# Patient Record
Sex: Male | Born: 1995 | Race: White | Hispanic: No | Marital: Married | Smoking: Former smoker
Health system: Southern US, Community
[De-identification: ages and names within clinical notes are randomized; demographics above are authoritative.]

## PROBLEM LIST (undated history)

## (undated) DIAGNOSIS — F909 Attention-deficit hyperactivity disorder, unspecified type: Secondary | ICD-10-CM

## (undated) DIAGNOSIS — S83249A Other tear of medial meniscus, current injury, unspecified knee, initial encounter: Secondary | ICD-10-CM

## (undated) HISTORY — PX: FOOT SURGERY: SHX648

## (undated) HISTORY — DX: Other tear of medial meniscus, current injury, unspecified knee, initial encounter: S83.249A

## (undated) HISTORY — PX: DENTAL SURGERY: SHX609

---
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
## 2014-11-28 ENCOUNTER — Emergency Department: Payer: 59

## 2014-11-28 ENCOUNTER — Emergency Department
Admission: EM | Admit: 2014-11-28 | Discharge: 2014-11-28 | Disposition: A | Discharge status: Home / Self Care | Payer: 59 | Attending: Emergency Medicine | Admitting: Emergency Medicine

## 2014-11-28 ENCOUNTER — Encounter: Payer: Self-pay | Admitting: Emergency Medicine

## 2014-11-28 DIAGNOSIS — X58XXXA Exposure to other specified factors, initial encounter: Secondary | ICD-10-CM | POA: Insufficient documentation

## 2014-11-28 DIAGNOSIS — S8391XA Sprain of unspecified site of right knee, initial encounter: Secondary | ICD-10-CM

## 2014-11-28 DIAGNOSIS — Z720 Tobacco use: Secondary | ICD-10-CM | POA: Diagnosis not present

## 2014-11-28 DIAGNOSIS — S8991XA Unspecified injury of right lower leg, initial encounter: Secondary | ICD-10-CM | POA: Diagnosis present

## 2014-11-28 DIAGNOSIS — Y998 Other external cause status: Secondary | ICD-10-CM | POA: Diagnosis not present

## 2014-11-28 DIAGNOSIS — Y9389 Activity, other specified: Secondary | ICD-10-CM | POA: Insufficient documentation

## 2014-11-28 DIAGNOSIS — Y9289 Other specified places as the place of occurrence of the external cause: Secondary | ICD-10-CM | POA: Insufficient documentation

## 2014-11-28 MED ORDER — TRAMADOL HCL 50 MG PO TABS: 50.0000 mg | ORAL_TABLET | Freq: Four times a day (QID) | ORAL | Status: DC | PRN

## 2014-11-28 MED ORDER — NAPROXEN 500 MG PO TABS: 500.0000 mg | ORAL_TABLET | Freq: Two times a day (BID) | ORAL | Status: DC

## 2014-11-28 NOTE — ED Notes (Signed)
Stepped stepped wrong on it, felt pop

## 2014-11-28 NOTE — ED Provider Notes (Signed)
CSN: [id number]     Arrival date & time 11/28/14  1703 History   First MD Initiated Contact with Patient 11/28/14 1906     Chief Complaint  Patient presents with  . Knee Pain    twisted stepping off stool 2 hour ago     (Consider location/radiation/quality/duration/timing/severity/associated sxs/prior Treatment) HPI  19 year old male stepped off of a stool and felt a pop into his right knee approximately 3:00 this afternoon. Patient was unable to ambulate. He is having significant pain in his right knee. Pain is described as sharp. Pain is moderate to severe. Pain is increased with range of motion and walking and some relief with sitting. No numbness or tingling. No hip or back pain.  History reviewed. No pertinent past medical history. History reviewed. No pertinent past surgical history. No family history on file. History  Substance Use Topics  . Smoking status: Current Every Day Smoker    Types: Cigarettes  . Smokeless tobacco: Not on file  . Alcohol Use: Yes    Review of Systems  Constitutional: Negative.  Negative for fever, chills, activity change and appetite change.  HENT: Negative for congestion, ear pain, mouth sores, rhinorrhea, sinus pressure, sore throat and trouble swallowing.   Eyes: Negative for photophobia, pain and discharge.  Respiratory: Negative for cough, chest tightness and shortness of breath.   Cardiovascular: Negative for chest pain and leg swelling.  Gastrointestinal: Negative for nausea, vomiting, abdominal pain, diarrhea and abdominal distention.  Genitourinary: Negative for dysuria and difficulty urinating.  Musculoskeletal: Positive for joint swelling, arthralgias and gait problem.  Skin: Negative for color change and rash.  Neurological: Negative for dizziness and headaches.  Hematological: Negative for adenopathy.  Psychiatric/Behavioral: Negative for behavioral problems and agitation.      Allergies  Review of patient's allergies indicates  no known allergies.  Home Medications   Prior to Admission medications   Not on File   BP 107/57 mmHg  Pulse 68  Temp(Src) 98.5 F (36.9 C) (Oral)  Resp 18  Ht 6\' 1"  (1.854 m)  Wt 220 lb (99.791 kg)  BMI 29.03 kg/m2  SpO2 97% Physical Exam  Constitutional: He is oriented to person, place, and time. He appears well-developed and well-nourished.  HENT:  Head: Normocephalic and atraumatic.  Eyes: Conjunctivae and EOM are normal. Pupils are equal, round, and reactive to light.  Neck: Normal range of motion. Neck supple.  Cardiovascular: Normal rate and intact distal pulses.   Pulmonary/Chest: Effort normal. No respiratory distress.  Musculoskeletal: Normal range of motion. He exhibits no edema or tenderness.  Examination of the right lower extremity shows patient has no swelling warmth erythema or effusion. He has full range of motion of the hip knee and ankle. Patient is tender along the medial lateral joint line of the right knee. He is able to maintain full extension. Patella tracks well and patellofemoral groove. He is neurovascularly intact in right lower extremity.  Neurological: He is alert and oriented to person, place, and time.  Skin: Skin is warm and dry.  Psychiatric: He has a normal mood and affect. His behavior is normal. Judgment and thought content normal.    ED Course  Procedures (including critical care time) Labs Review Labs Reviewed - No data to display  Imaging Review Dg Knee Complete 4 Views Right  11/28/2014   CLINICAL DATA:  Fall, right knee pain  EXAM: RIGHT KNEE - COMPLETE 4+ VIEW  COMPARISON:  None.  FINDINGS: There is no evidence of fracture, dislocation,  or joint effusion. There is no evidence of arthropathy or other focal bone abnormality. Soft tissues are unremarkable.  IMPRESSION: Negative.   Electronically Signed   By: Christiana Pellant M.D.   On: 11/28/2014 18:05     EKG Interpretation None      MDM   Final diagnoses:  Right knee sprain,  initial encounter    19 year old male with right twisting knee injury. Exam shows no effusion or ligamentous laxity. X-ray show no fracture. Patient will use crutches from home to ambulate. He'll rest ice elevate the lower extremity. Work on gentle knee range of motion. Take naproxen as needed for pain. Follow-up with orthopedics first of next week.    Evon Slack, PA-C 11/28/14 1918  Jene Every, MD 11/28/14 2032

## 2014-11-28 NOTE — ED Notes (Signed)
Pt. Father driving pt. Home.

## 2014-11-28 NOTE — Discharge Instructions (Signed)
Cryotherapy Cryotherapy is when you put ice on your injury. Ice helps lessen pain and puffiness (swelling) after an injury. Ice works the best when you start using it in the first 24 to 48 hours after an injury. HOME CARE  Put a dry or damp towel between the ice pack and your skin.  You may press gently on the ice pack.  Leave the ice on for no more than 10 to 20 minutes at a time.  Check your skin after 5 minutes to make sure your skin is okay.  Rest at least 20 minutes between ice pack uses.  Stop using ice when your skin loses feeling (numbness).  Do not use ice on someone who cannot tell you when it hurts. This includes small children and people with memory problems (dementia). GET HELP RIGHT AWAY IF:  You have white spots on your skin.  Your skin turns blue or pale.  Your skin feels waxy or hard.  Your puffiness gets worse. MAKE SURE YOU:   Understand these instructions.  Will watch your condition.  Will get help right away if you are not doing well or get worse. Document Released: 10/11/2007 Document Revised: 07/17/2011 Document Reviewed: 12/15/2010 [facility] Patient Information 2015 [place], [place]. This information is not intended to replace advice given to you by your health care provider. Make sure you discuss any questions you have with your health care provider.  Combined Knee Ligament Sprain Combined knee ligament sprain is a tear of more than one of the major ligaments of the knee. The four knee ligaments are the anterior cruciate ligament (ACL), posterior cruciate ligament (PCL), medial collateral ligament (MCL) and lateral collateral ligament (LCL). Ligaments connect bones. They often cross a joint to hold the bones together. The ligaments of the knee keep the thigh bone (femur) and shinbone (tibia) in alignment. These ligaments allow the joint to move within a certain range of motion. Movement outside this range causes a ligament strain. Injury to multiple  ligaments at the same time results in difficulty playing sports and in daily living. The most common multiple knee ligament injury involves the ACL and MCL. SYMPTOMS   A "popping" sound heard or felt at the time of injury.  Inability to continue activity after injury.  Inflammation of the knee within 6 hours after injury.  Possibly, deformity of the knee.  Inability to straighten the knee.  Feeling of the knee giving way or buckling.  Sometimes, locking of the knee, if the joint cartilage (meniscus) is injured.  Rarely, numbness, weakness, paralysis, discoloration, or coldness, due to nerve or blood vessel injury. CAUSES  Spraining of multiple ligaments occurs when a force is placed on the ligaments that exceeds their strength. This is often caused by a direct hit (trauma). It may also be caused by a non-contact injury (hyperextending the knee while twisting it).  RISK INCREASES WITH:  Contact sports (football, rugby, lacrosse). Sports that involve pivoting, jumping, cutting, or changing direction (basketball, gymnastics, soccer, volleyball). Sports on uneven ground (cross-country running, soccer).  Poor strength and/or flexibility.  Improper fitted or padded equipment. PREVENTION  Warm up and stretch properly before activity.  Maintain physical fitness:  Thigh, leg, and knee flexibility.  Muscle strength and endurance.  Learn and use proper exercise technique.  Wear proper and well fitting equipment (correct length of cleats for surface). PROGNOSIS  Without treatment, the knee will continue to give way and become vulnerable to recurring injury. Recurring injury can happen during athletics or daily  living. If the injury includes damage to a nerve or artery, the chance of a poor outcome increases. Surgery is often needed to regain stability of the knee. RELATED COMPLICATIONS  Frequently recurring symptoms, including:  Knee giving way.  Joint  instability.  Inflammation.  Injury to the joint cartilage (meniscus). This may result in locking and/or swelling of the knee.  Injury to joint (articular) cartilage of the thigh bone or shinbone. This may result in arthritis of the knee.  Injury to other ligaments of the knee.  Knee stiffness (loss of knee motion).  Permanent injury to nerves (numbness, weakness, or paralysis) or arteries.  Removal (amputation) of the leg, due to nerve or artery injury. TREATMENT  Treatment first involves medicine and ice, to reduce pain and inflammation. Crutches may be advised, to decrease pain while walking. The knee may be restrained. Rehabilitation focuses on reducing swelling, regaining range of motion, and regaining muscle control and strength. It may also include receiving proper use training, wearing a brace, and education. (Avoid sports that involve pivoting, cutting, changing direction, jumping and landing). Surgery often offers the best chance for full recovery. Surgery from combined ACL/MCL injury involves replacement (reconstruction) of the ACL. This also allows for MCL healing. Despite surgery, some athletes may never return to their prior level of competition. The ability to return to sports depends on the related injuries and demands of the sport.  MEDICATION   If pain medicine is needed, nonsteroidal anti-inflammatory medicines (aspirin and ibuprofen), or other minor pain relievers (acetaminophen), are often advised.  Do not take pain medicine for 7 days before surgery.  Stronger pain relievers may be prescribed. Use only as directed and only as much as you need.  Contact your caregiver immediately if any bleeding, stomach upset, or signs of an allergic reaction occur. COLD THERAPY  Cold treatment (icing) should be applied for 10 to 15 minutes every 2 to 3 hours for inflammation and pain, and immediately after activity that aggravates your symptoms. Use ice packs or an ice massage. SEEK  MEDICAL CARE IF:   Symptoms get worse or do not improve in 6 weeks, despite treatment.  After injury or surgery, any of the following occur:  Pain, numbness, coldness, or a blue, gray, or dark color occurs in the foot or toenails.  Increased pain, swelling, redness, drainage of fluids, or bleeding in the affected area.  Signs of infection (headache, muscle aches, dizziness, or a general ill feeling with fever).  New, unexplained symptoms develop. (Drugs used in treatment may produce side effects.) Document Released: 04/24/2005 Document Revised: 07/17/2011 Document Reviewed: 08/06/2008 [facility] Patient Information 2015 [place], [place]. This information is not intended to replace advice given to you by your health care provider. Make sure you discuss any questions you have with your health care provider.

## 2017-02-06 ENCOUNTER — Encounter: Payer: Self-pay | Admitting: Family Medicine

## 2017-02-06 ENCOUNTER — Ambulatory Visit (INDEPENDENT_AMBULATORY_CARE_PROVIDER_SITE_OTHER): Payer: 59 | Admitting: Family Medicine

## 2017-02-06 VITALS — BP 122/66 | HR 70 | Temp 97.9°F | Ht 73.5 in | Wt 239.6 lb

## 2017-02-06 DIAGNOSIS — Z114 Encounter for screening for human immunodeficiency virus [HIV]: Secondary | ICD-10-CM

## 2017-02-06 DIAGNOSIS — H5789 Other specified disorders of eye and adnexa: Secondary | ICD-10-CM | POA: Diagnosis not present

## 2017-02-06 DIAGNOSIS — H1133 Conjunctival hemorrhage, bilateral: Secondary | ICD-10-CM | POA: Diagnosis not present

## 2017-02-06 NOTE — Patient Instructions (Signed)

## 2017-02-06 NOTE — Progress Notes (Signed)
Patient: Roy Huffman Male    DOB: March 18, 1996   21 y.o.   MRN: [id number] Visit Date: 02/06/2017  Today's Provider: Dortha Kern, PA   Chief Complaint  Patient presents with  . Establish Care   Subjective:   Roy Huffman is a 21 year old male who presents today to Establish Care as a new patient. Patient was referred by Dr. Alvester Morin. He was being seen for bilateral eye redness. He was diagnosed with hemorrhage behind the eye. Dr. Alvester Morin requested labs be done including PTT time and Sed rate.   Eye Pain   Both eyes are affected.This is a new problem. The current episode started yesterday. The problem occurs constantly. The problem has been gradually worsening. 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 The pain is moderate. There is no known exposure to pink eye. He does not wear contacts. Associated symptoms include eye redness and photophobia.    Previous Medications   No medications on file   Review of Systems  Eyes: Positive for photophobia, pain and redness.  Respiratory: Positive for chest tightness and shortness of breath.   Gastrointestinal: Positive for abdominal distention.  Musculoskeletal: Positive for back pain.   Social History  Substance Use Topics  . Smoking status: Current Every Day Smoker    Types: E-cigarettes  . Smokeless tobacco: Never Used  . Alcohol use Yes     Comment: occasionally    Objective:   BP 122/66 (BP Location: Right Arm, Patient Position: Sitting, Cuff Size: Normal)   Pulse 70   Temp 97.9 F (36.6 C) (Oral)   Ht 6' 1.5" (1.867 m)   Wt 239 lb 9.6 oz (108.7 kg)   SpO2 97%   BMI 31.18 kg/m   Physical Exam  Constitutional: He is oriented to person, place, and time. He appears well-developed and well-nourished.  HENT:  Head: Normocephalic and atraumatic.  Left Ear: External ear normal.  Nose: Nose normal.  Mouth/Throat: Oropharynx is clear and moist.  Eyes: Pupils are  equal, round, and reactive to light.  Heavy subconjunctival hemorrhages bilaterally with some soreness. Slightly photosensitive.   Neck: Neck supple.  Cardiovascular: Normal rate.   Pulmonary/Chest: Effort normal and breath sounds normal.  Abdominal: Soft. Bowel sounds are normal.  Lymphadenopathy:    He has cervical adenopathy.  Neurological: He is alert and oriented to person, place, and time.  Skin: No rash noted.  Psychiatric: He has a normal mood and affect. His behavior is normal. Thought content normal.      Assessment & Plan:     1. Subconjunctival hemorrhage, bilateral Onset yesterday with some sensitivity to light and irritation. Was evaluated by Dr. Alvester Morin and was concerned about underlying causes for these hemorrhages. 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 Admits to drinking a 750 ml bottle of liquor on 02-04-17. Vomited once but did not feel he strained much. Will check for diabetes, bleeding disorder, hepatitis, anemia, etc. Follow up with eye doctor for persistent vision changes. - Sedimentation rate - PTT - CBC with Differential/Platelet - Comprehensive metabolic panel  2. Eye irritation Onset with subconjunctival hemorrhage. Will check labs requested by Dr. Alvester Morin (optometrist). Follow up with him pending reports or persistent irritation. - Sedimentation rate - PTT - CBC with Differential/Platelet - Comprehensive metabolic panel  3. Screening for HIV (human immunodeficiency virus) - HIV antibody

## 2017-02-07 LAB — COMPREHENSIVE METABOLIC PANEL
AG RATIO: 1.8 (calc) (ref 1.0–2.5)
ALBUMIN MSPROF: 4.6 g/dL (ref 3.6–5.1)
ALKALINE PHOSPHATASE (APISO): 61 U/L (ref 40–115)
ALT: 48 U/L — ABNORMAL HIGH (ref 9–46)
AST: 25 U/L (ref 10–40)
BILIRUBIN TOTAL: 0.5 mg/dL (ref 0.2–1.2)
BUN: 11 mg/dL (ref 7–25)
CALCIUM: 9.6 mg/dL (ref 8.6–10.3)
CHLORIDE: 104 mmol/L (ref 98–110)
CO2: 28 mmol/L (ref 20–32)
Creat: 1.08 mg/dL (ref 0.60–1.35)
GLOBULIN: 2.5 g/dL (ref 1.9–3.7)
Glucose, Bld: 87 mg/dL (ref 65–99)
POTASSIUM: 4.4 mmol/L (ref 3.5–5.3)
SODIUM: 141 mmol/L (ref 135–146)
TOTAL PROTEIN: 7.1 g/dL (ref 6.1–8.1)

## 2017-02-07 LAB — CBC WITH DIFFERENTIAL/PLATELET
Basophils Absolute: 79 cells/uL (ref 0–200)
Basophils Relative: 1 %
EOS PCT: 3.2 %
Eosinophils Absolute: 253 cells/uL (ref 15–500)
HEMATOCRIT: 44.3 % (ref 38.5–50.0)
HEMOGLOBIN: 15.4 g/dL (ref 13.2–17.1)
Lymphs Abs: 2481 cells/uL (ref 850–3900)
MCH: 30.6 pg (ref 27.0–33.0)
MCHC: 34.8 g/dL (ref 32.0–36.0)
MCV: 87.9 fL (ref 80.0–100.0)
MPV: 9.8 fL (ref 7.5–12.5)
Monocytes Relative: 9 %
NEUTROS ABS: 4377 {cells}/uL (ref 1500–7800)
NEUTROS PCT: 55.4 %
Platelets: 334 10*3/uL (ref 140–400)
RBC: 5.04 10*6/uL (ref 4.20–5.80)
RDW: 12.6 % (ref 11.0–15.0)
Total Lymphocyte: 31.4 %
WBC mixed population: 711 cells/uL (ref 200–950)
WBC: 7.9 10*3/uL (ref 3.8–10.8)

## 2017-02-07 LAB — SEDIMENTATION RATE: SED RATE: 2 mm/h (ref 0–15)

## 2017-02-07 LAB — APTT: aPTT: 27 s (ref 22–34)

## 2017-02-07 LAB — HIV ANTIBODY (ROUTINE TESTING W REFLEX): HIV: NONREACTIVE

## 2017-03-05 ENCOUNTER — Encounter: Payer: Self-pay | Admitting: Family Medicine

## 2017-04-18 IMAGING — CR DG KNEE COMPLETE 4+V*R*
4 series · 4 of 4 positions shown · non-contrast
Comparison: None.

CLINICAL DATA: Fall, right knee pain

EXAM:
RIGHT KNEE - COMPLETE 4+ VIEW

[knee ap]
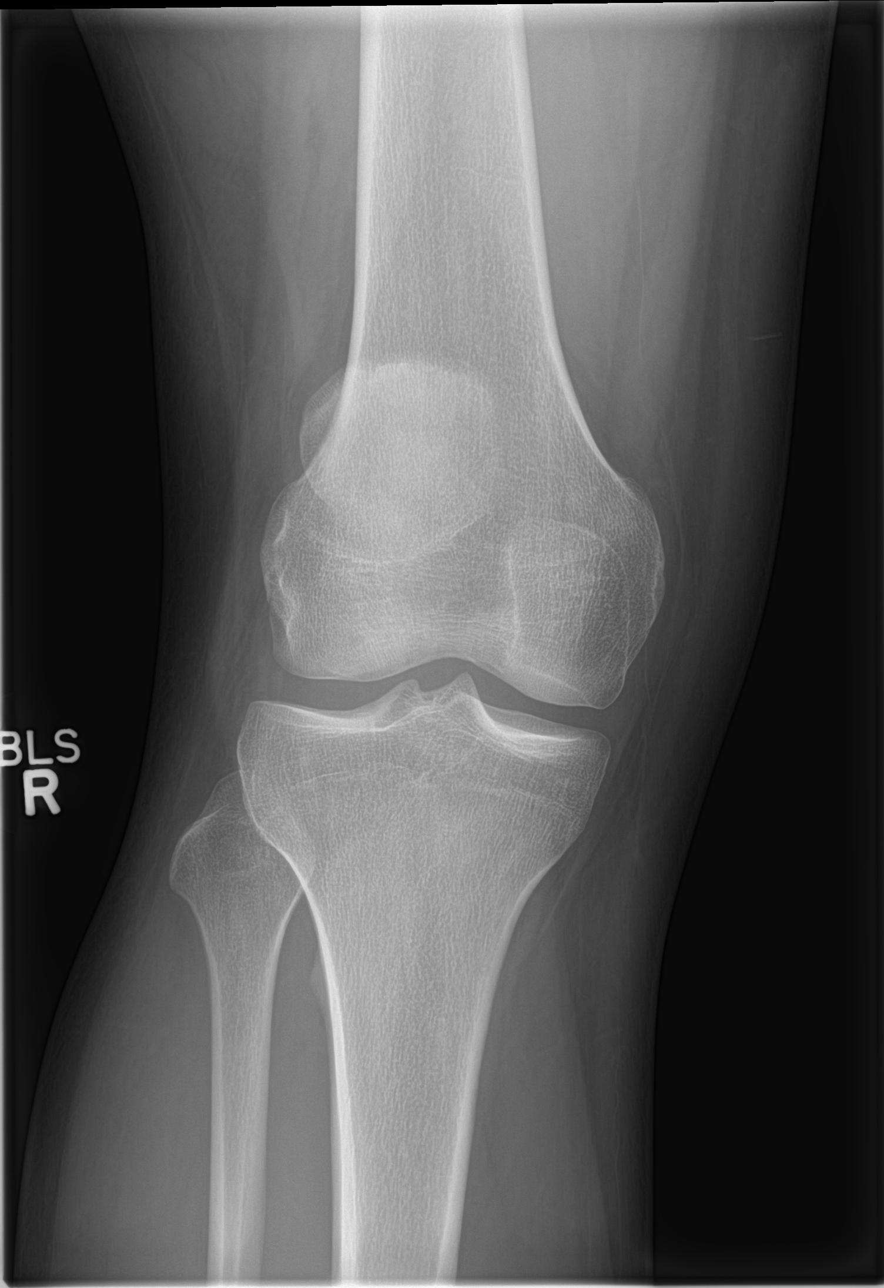

[knee obl (1 of 2)]
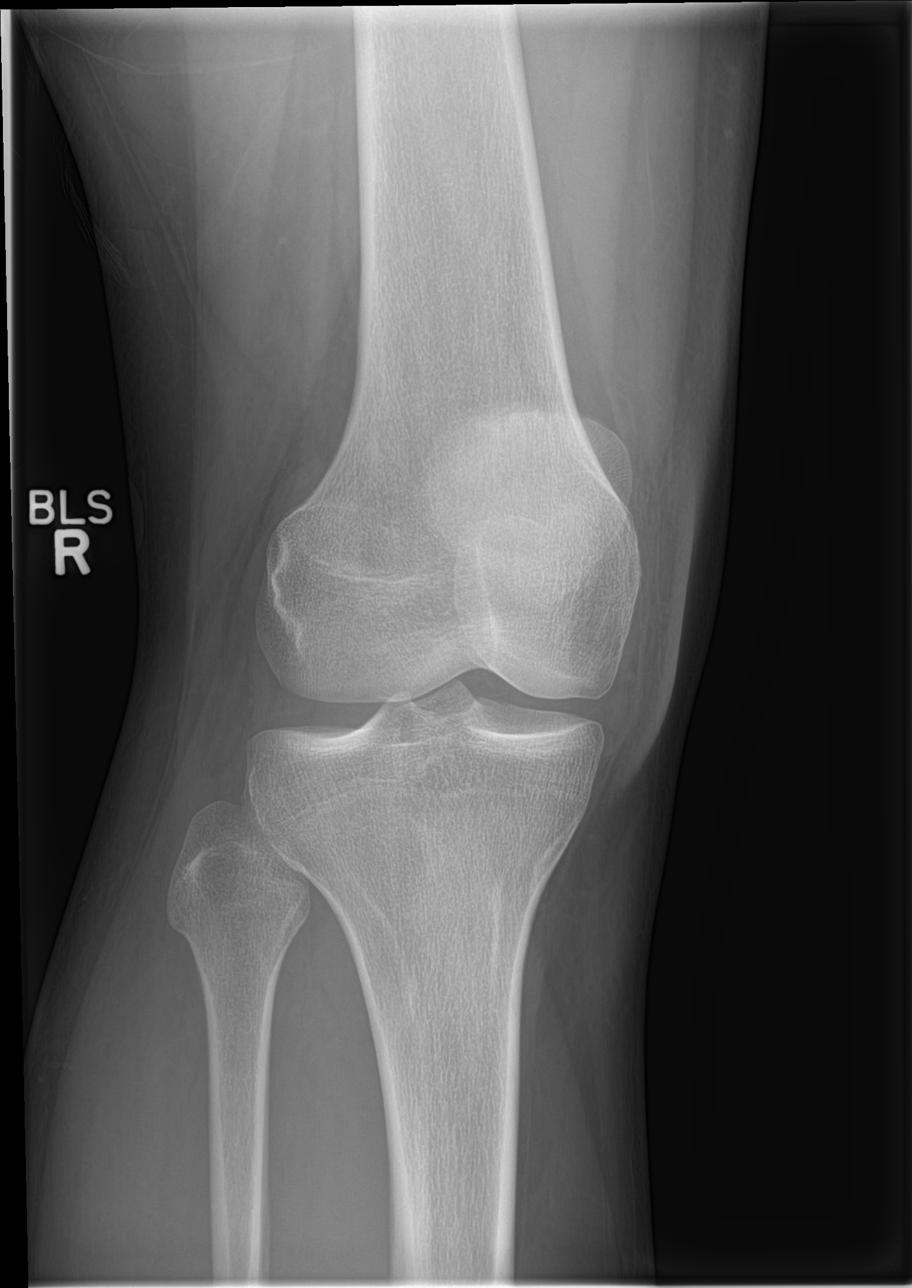

[knee obl (2 of 2)]
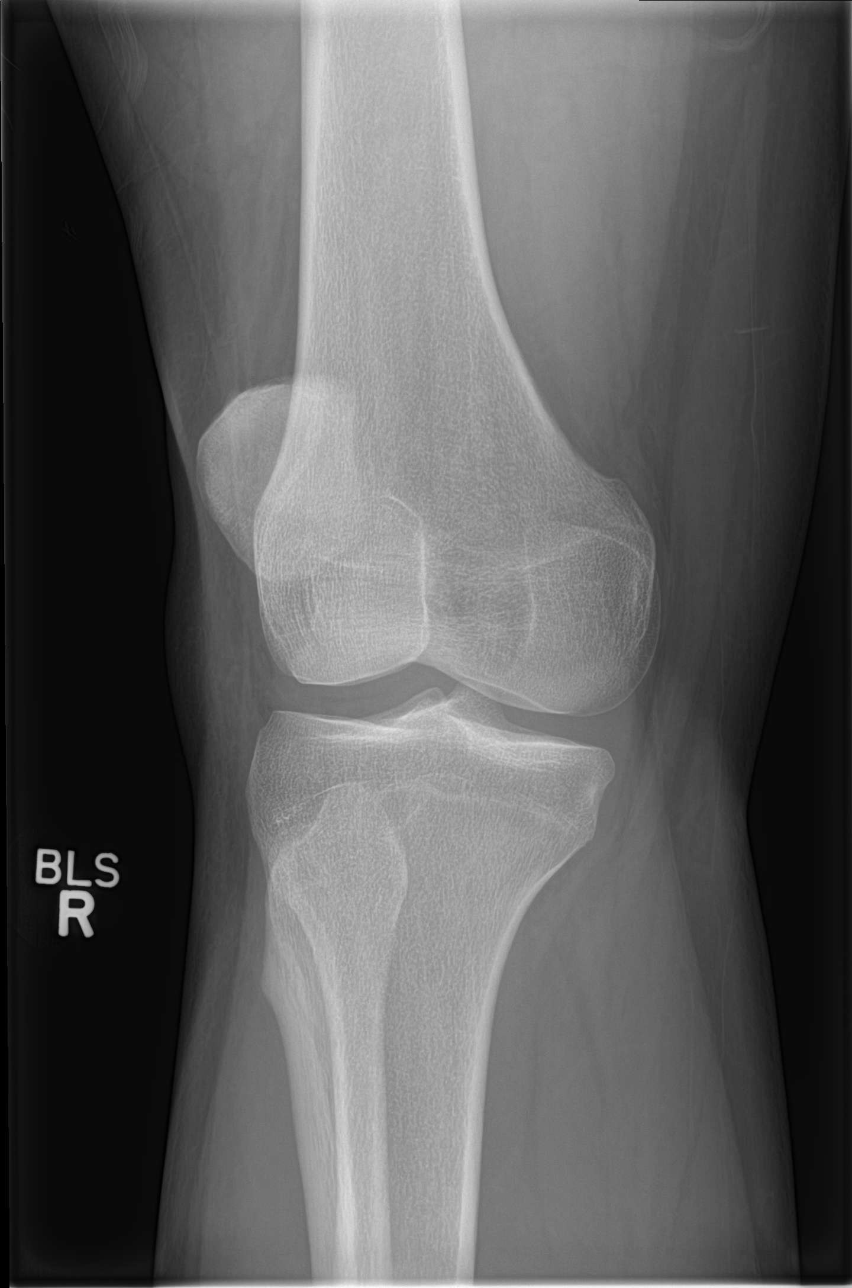

[knee lat]
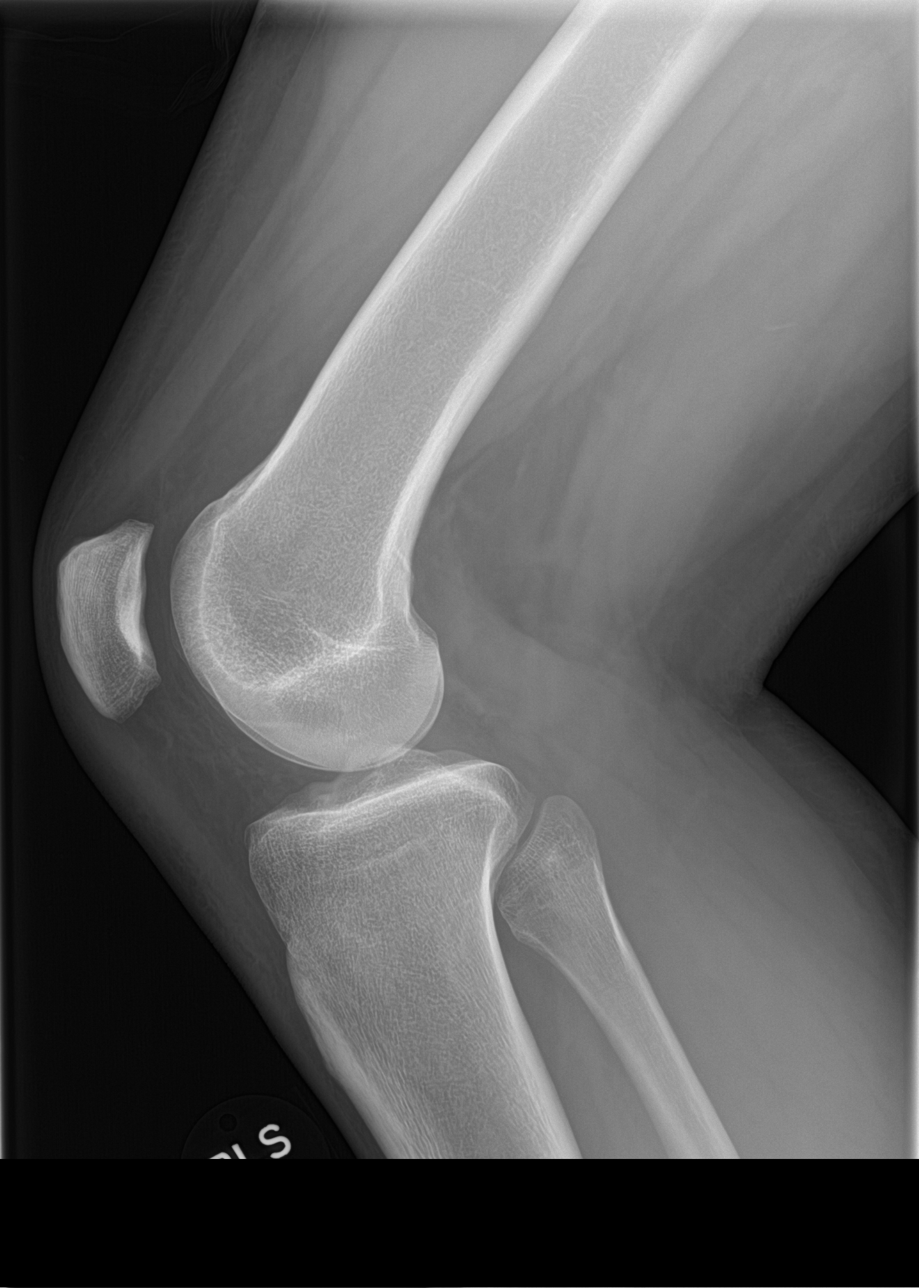

[4 of 4 positions shown; findings below may reference images not displayed]

FINDINGS: There is no evidence of fracture, dislocation, or joint effusion.
There is no evidence of arthropathy or other focal bone abnormality.
Soft tissues are unremarkable.
IMPRESSION: Negative.

## 2017-06-21 ENCOUNTER — Ambulatory Visit (INDEPENDENT_AMBULATORY_CARE_PROVIDER_SITE_OTHER): Payer: Managed Care, Other (non HMO) | Admitting: Orthopedic Surgery

## 2017-06-21 ENCOUNTER — Ambulatory Visit (INDEPENDENT_AMBULATORY_CARE_PROVIDER_SITE_OTHER): Payer: Managed Care, Other (non HMO)

## 2017-06-21 ENCOUNTER — Encounter (INDEPENDENT_AMBULATORY_CARE_PROVIDER_SITE_OTHER): Payer: Self-pay | Admitting: Orthopedic Surgery

## 2017-06-21 VITALS — Ht 73.0 in | Wt 239.0 lb

## 2017-06-21 DIAGNOSIS — G8929 Other chronic pain: Secondary | ICD-10-CM | POA: Diagnosis not present

## 2017-06-21 DIAGNOSIS — M25561 Pain in right knee: Secondary | ICD-10-CM

## 2017-06-21 NOTE — Progress Notes (Signed)
   Office Visit Note   Patient: Roy Huffman           Date of Birth: 01/11/1996           MRN: [id number] Visit Date: 06/21/2017              Requested by: Tamsen Roershrismon, Dennis E, PA [address] [place], [place] [address] PCP: Tamsen Roershrismon, Dennis E, [place]  Chief Complaint  Patient presents with  . Right Knee - Pain    Injury x 2 years ago.       HPI: The patient is a  22 year old gentleman seen today for evaluation of right knee pain. Has been ongoing for 2 years following stepping backwards and slipping and twisting his right knee. Heard a pop at that time. Has had giving way and instability in knee for last 2 years. Did have a cortisone injection over a year ago for same, this provided no relief. Has tried a home exercise program without improvement. No taking anything for pain currently. Is concerned for a tear in his knee.  Assessment & Plan: Visit Diagnoses:  1. Chronic pain of right knee     Plan: offered Depomedrol injection. Declined today. Will proceed with MRI to evaluate for meniscal injury. Follow for mri review with Dr. Lajoyce Cornersduda.  Follow-Up Instructions: No Follow-up on file.   Right Knee Exam   Muscle Strength  The patient has normal right knee strength.  Tenderness  The patient is experiencing tenderness in the lateral joint line.  Range of Motion  The patient has normal right knee ROM.  Tests  Varus: negative   Other  Erythema: absent Effusion: no effusion present  Comments:  Little valgus laxity as compared to left knee      Patient is alert, oriented, no adenopathy, well-dressed, normal affect, normal respiratory effort.   Imaging: No results found. No images are attached to the encounter.  Labs: Lab Results  Component Value Date   ESRSEDRATE 2 02/06/2017    @LABSALLVALUES (HGBA1)@  Body mass index is 31.53 kg/m.  Orders:  Orders Placed This Encounter  Procedures  . XR Knee 1-2 Views Right   No orders of the defined types were placed  in this encounter.    Procedures: No procedures performed  Clinical Data: No additional findings.  ROS:  All other systems negative, except as noted in the HPI. Review of Systems  Constitutional: Negative for chills and fever.  Musculoskeletal: Positive for arthralgias. Negative for joint swelling and myalgias.  Neurological: Negative for weakness and numbness.    Objective: Vital Signs: Ht 6\' 1"  (1.854 m)   Wt 239 lb (108.4 kg)   BMI 31.53 kg/m   Specialty Comments:  No specialty comments available.  PMFS History: There are no active problems to display for this patient.  Past Medical History:  Diagnosis Date  . Acute medial meniscus tear     No family history on file.  No past surgical history on file. Social History   Occupational History  . Not on file  Tobacco Use  . Smoking status: Current Every Day Smoker    Types: E-cigarettes  . Smokeless tobacco: Never Used  Substance and Sexual Activity  . Alcohol use: Yes    Comment: occasionally   . Drug use: No  . Sexual activity: Not on file

## 2017-06-29 ENCOUNTER — Ambulatory Visit (HOSPITAL_COMMUNITY): Admission: RE | Admit: 2017-06-29 | Payer: Managed Care, Other (non HMO) | Source: Ambulatory Visit

## 2017-06-30 ENCOUNTER — Ambulatory Visit (HOSPITAL_COMMUNITY)
Admission: RE | Admit: 2017-06-30 | Discharge: 2017-06-30 | Disposition: A | Discharge status: Home / Self Care | Payer: Managed Care, Other (non HMO) | Source: Ambulatory Visit | Attending: Family | Admitting: Family

## 2017-06-30 DIAGNOSIS — G8929 Other chronic pain: Secondary | ICD-10-CM | POA: Diagnosis present

## 2017-06-30 DIAGNOSIS — M25461 Effusion, right knee: Secondary | ICD-10-CM | POA: Insufficient documentation

## 2017-06-30 DIAGNOSIS — M794 Hypertrophy of (infrapatellar) fat pad: Secondary | ICD-10-CM | POA: Diagnosis not present

## 2017-06-30 DIAGNOSIS — M25561 Pain in right knee: Secondary | ICD-10-CM | POA: Diagnosis present

## 2017-07-04 ENCOUNTER — Encounter (INDEPENDENT_AMBULATORY_CARE_PROVIDER_SITE_OTHER): Payer: Self-pay | Admitting: Orthopedic Surgery

## 2017-07-04 ENCOUNTER — Ambulatory Visit (INDEPENDENT_AMBULATORY_CARE_PROVIDER_SITE_OTHER): Payer: Managed Care, Other (non HMO) | Admitting: Orthopedic Surgery

## 2017-07-04 VITALS — Ht 73.0 in | Wt 239.0 lb

## 2017-07-04 DIAGNOSIS — G8929 Other chronic pain: Secondary | ICD-10-CM

## 2017-07-04 DIAGNOSIS — M25561 Pain in right knee: Secondary | ICD-10-CM

## 2017-07-04 NOTE — Progress Notes (Signed)
   Office Visit Note   Patient: Roy Huffman           Date of Birth: [birth year]           MRN: [id number] Visit Date: 07/04/2017              Requested by: Tamsen Roershrismon, Dennis E, PA [address] [place], [place] [address] PCP: Tamsen Roershrismon, Dennis E, [place]  Chief Complaint  Patient presents with  . Right Knee - Follow-up    MRI review      HPI: Patient is a 22 year old gentleman who presents complaining of pain in the patellofemoral joint.  Patient states he has had an episode of his knee just getting out of him.  Patient is concerned that this may be due to ligamentous instability of his knee.  He states is been having symptoms for 2 years.  He states his knee is not bothering him today.  Assessment & Plan: Visit Diagnoses:  1. Chronic pain of right knee     Plan: Patient was given instructions for closed chain kinetic exercises as well as quad isometric straight leg raises to strengthen his VMO.  Discussed the mechanism of lateral tracking of the patella and strengthening exercises to improve patellar tracking.  Recommended Aleve 2 p.o. twice daily for 2 weeks with food discontinue if he develops stomach upset.  Follow-Up Instructions: Return if symptoms worsen or fail to improve.   Ortho Exam  Patient is alert, oriented, no adenopathy, well-dressed, normal affect, normal respiratory effort. Examination patient has a normal gait.  He has lateral tilt to the patella and lateral tracking of the patella.  He is tender to palpation of the medial lateral joint line of the patellofemoral joint.  Medial lateral joint lines are nontender to palpation collaterals and cruciates are stable.  Review of the MRI scan shows lateral tracking of the patella with some edema laterally.  His ACL PCL and collateral ligaments are stable and intact no meniscal tear.  Imaging: No results found. No images are attached to the encounter.  Labs: Lab Results  Component Value Date   ESRSEDRATE 2 02/06/2017     @LABSALLVALUES (HGBA1)@  Body mass index is 31.53 kg/m.  Orders:  No orders of the defined types were placed in this encounter.  No orders of the defined types were placed in this encounter.    Procedures: No procedures performed  Clinical Data: No additional findings.  ROS:  All other systems negative, except as noted in the HPI. Review of Systems  Objective: Vital Signs: Ht 6\' 1"  (1.854 m)   Wt 239 lb (108.4 kg)   BMI 31.53 kg/m   Specialty Comments:  No specialty comments available.  PMFS History: There are no active problems to display for this patient.  Past Medical History:  Diagnosis Date  . Acute medial meniscus tear     History reviewed. No pertinent family history.  History reviewed. No pertinent surgical history. Social History   Occupational History  . Not on file  Tobacco Use  . Smoking status: Current Every Day Smoker    Types: E-cigarettes  . Smokeless tobacco: Never Used  Substance and Sexual Activity  . Alcohol use: Yes    Comment: occasionally   . Drug use: No  . Sexual activity: Not on file

## 2018-09-10 ENCOUNTER — Telehealth: Payer: Self-pay

## 2018-09-10 NOTE — Telephone Encounter (Signed)
Spoke with patient. He stated that he was diagnosed with covid19 back in mid March. He still having issues with SOB. He wants to be seen as a new patient. I advised him that I would have to send a message to one of our doctors to see if it is safe for him to come into the office or if he will need to wait until he is not having ANY symptoms. He verbalized understanding.   Spoke with Merrilee Seashore for our office policy on this, she stated that since the patient is still having issues with SOB, he will need to remain isolated until his symptoms have resolved.   Dr. Sherene Sires, as you are the only provider in the office still seeing consults, what would you advise? Should he wait a while longer before coming into our office or do you feel its ok to get him scheduled now? Thanks!

## 2018-09-10 NOTE — Telephone Encounter (Signed)
I see no records in our system - I'd be happy to review them and make a rec or she could return to whoever did the test to be sure it's now negative but since we don't have that test here we should not be seeing her until sure it's negative

## 2018-09-10 NOTE — Telephone Encounter (Signed)
Unfortunately we have had reports of late relapse but could also have a secondary infection so since the fever is so recent (w/in 2 weeks) best for pt to return to site where the test was first done and have it repeated and if negative we can see them right away.

## 2018-09-10 NOTE — Telephone Encounter (Signed)
Called and spoke with Misty Stanley with [facility] where pt went to be tested for COVID in March 2020 She is faxing over all documents on pt's ov in last few weeks today to triage fax Awaiting faxes to be given to MW for review  Called and spoke with patient regarding MW response below Pt advised that last week he had a fever over 101 and was isolated for 72 hours  Pt is currently back to work still not feeling well, SOB, and productive cough Pt advised that he feels that he has no fever, but has not checked it in last 24hrs.

## 2018-09-10 NOTE — Telephone Encounter (Signed)
Called that patient back and advised him of Dr. Thurston Hole response and that [facility] will be faxing documents over. Patient voiced understanding and will call back after testing has been completed. Nothing further needed at this time.

## 2018-09-18 ENCOUNTER — Encounter: Payer: Self-pay | Admitting: Internal Medicine

## 2018-09-18 ENCOUNTER — Telehealth: Payer: Self-pay | Admitting: Internal Medicine

## 2018-09-18 DIAGNOSIS — R05 Cough: Secondary | ICD-10-CM | POA: Insufficient documentation

## 2018-09-18 DIAGNOSIS — R053 Chronic cough: Secondary | ICD-10-CM | POA: Insufficient documentation

## 2018-09-18 NOTE — Telephone Encounter (Signed)
Received results and placed in Dr Thurston Hole lookat

## 2018-09-18 NOTE — Telephone Encounter (Signed)
Called and spoke with pt letting him know that MW did review the results of the COVID test and said that we could get him scheduled for an appt. Pt verbalized understanding. Consult has been scheduled for MW tomorrow, 5/14 at 10am with MW. Pt was made aware of office address and was also told to wear a mask to appt. Nothing further needed.

## 2018-09-18 NOTE — Telephone Encounter (Signed)
Called and spoke with pt who stated he has faxed over recent test results that were performed for the retesting of COVID. Stated to pt once we do receive the results and after MW reviews them, when we get the okay, we will call him to get him scheduled for a consult with MW and pt verbalized understanding.  Will keep encounter open until fax has been received by pt.

## 2018-09-18 NOTE — Telephone Encounter (Signed)
Sorry for the delay, let her know we will put in next avail slot

## 2018-09-19 ENCOUNTER — Encounter: Payer: Self-pay | Admitting: Internal Medicine

## 2018-09-19 ENCOUNTER — Ambulatory Visit (INDEPENDENT_AMBULATORY_CARE_PROVIDER_SITE_OTHER): Payer: Managed Care, Other (non HMO) | Admitting: Internal Medicine

## 2018-09-19 ENCOUNTER — Other Ambulatory Visit: Payer: Self-pay

## 2018-09-19 ENCOUNTER — Ambulatory Visit (INDEPENDENT_AMBULATORY_CARE_PROVIDER_SITE_OTHER): Payer: Managed Care, Other (non HMO)

## 2018-09-19 DIAGNOSIS — R0609 Other forms of dyspnea: Secondary | ICD-10-CM | POA: Diagnosis not present

## 2018-09-19 DIAGNOSIS — U071 COVID-19: Secondary | ICD-10-CM | POA: Diagnosis not present

## 2018-09-19 DIAGNOSIS — R05 Cough: Secondary | ICD-10-CM

## 2018-09-19 DIAGNOSIS — R053 Chronic cough: Secondary | ICD-10-CM

## 2018-09-19 LAB — CBC WITH DIFFERENTIAL/PLATELET
Basophils Absolute: 0.1 10*3/uL (ref 0.0–0.1)
Basophils Relative: 1.3 % (ref 0.0–3.0)
Eosinophils Absolute: 0.2 10*3/uL (ref 0.0–0.7)
Eosinophils Relative: 2.8 % (ref 0.0–5.0)
HCT: 42.6 % (ref 39.0–52.0)
Hemoglobin: 15.2 g/dL (ref 13.0–17.0)
Lymphocytes Relative: 38 % (ref 12.0–46.0)
Lymphs Abs: 2.5 10*3/uL (ref 0.7–4.0)
MCHC: 35.5 g/dL (ref 30.0–36.0)
MCV: 87.9 fl (ref 78.0–100.0)
Monocytes Absolute: 0.8 10*3/uL (ref 0.1–1.0)
Monocytes Relative: 11.8 % (ref 3.0–12.0)
Neutro Abs: 3 10*3/uL (ref 1.4–7.7)
Neutrophils Relative %: 46.1 % (ref 43.0–77.0)
Platelets: 326 10*3/uL (ref 150.0–400.0)
RBC: 4.85 Mil/uL (ref 4.22–5.81)
RDW: 13.9 % (ref 11.5–15.5)
WBC: 6.6 10*3/uL (ref 4.0–10.5)

## 2018-09-19 LAB — HEPATIC FUNCTION PANEL
ALT: 31 U/L (ref 0–53)
AST: 19 U/L (ref 0–37)
Albumin: 4.4 g/dL (ref 3.5–5.2)
Alkaline Phosphatase: 60 U/L (ref 39–117)
Bilirubin, Direct: 0.1 mg/dL (ref 0.0–0.3)
Total Bilirubin: 0.5 mg/dL (ref 0.2–1.2)
Total Protein: 7.3 g/dL (ref 6.0–8.3)

## 2018-09-19 LAB — BASIC METABOLIC PANEL
BUN: 6 mg/dL (ref 6–23)
CO2: 29 mEq/L (ref 19–32)
Calcium: 9.3 mg/dL (ref 8.4–10.5)
Chloride: 105 mEq/L (ref 96–112)
Creatinine, Ser: 0.99 mg/dL (ref 0.40–1.50)
GFR: 91.97 mL/min (ref >60.00)
Glucose, Bld: 82 mg/dL (ref 70–99)
Potassium: 3.7 mEq/L (ref 3.5–5.1)
Sodium: 140 mEq/L (ref 135–145)

## 2018-09-19 LAB — BRAIN NATRIURETIC PEPTIDE: Pro B Natriuretic peptide (BNP): 17 pg/mL (ref 0.0–100.0)

## 2018-09-19 LAB — SEDIMENTATION RATE: Sed Rate: 5 mm/hr (ref 0–15)

## 2018-09-19 MED ORDER — PREDNISONE 10 MG PO TABS: ORAL_TABLET | ORAL | 0 refills | Status: DC

## 2018-09-19 MED ORDER — OMEPRAZOLE MAGNESIUM 20 MG PO TBEC: DELAYED_RELEASE_TABLET | ORAL | Status: DC

## 2018-09-19 NOTE — Progress Notes (Signed)
Roy Huffman, male    DOB: 1995/07/14, 23 y.o.   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 but quit vaping 07/2018 tendency to bronchitis in winter but none since 2017 with limited outside contacts then acutely ill March  2020 fever cough sob  and in UC 3 days after and then back about 5 days later returned pos for Covid 19 rx zpak/ albuterol fever resolved and sob/cough improved 50% so referred to pulmonary clinic 09/19/2018 by [facility] [place]         History of Present Illness  09/19/2018  Pulmonary/ 1st office eval/Yordi Krager  Chief Complaint  Patient presents with   Pulmonary Consult    Referred [facility] [place]. Pt c/o SOB x 2 months. He also c/o non prod cough- triggered by taking in a deep breath.   Dyspnea:  Across a parking lot  Cough: random / dry not noct/  With deep insp   Sleep: sleeps with hob up 30 degrees = baseline   SABA use: not helping  No obvious day to day or daytime variability or assoc excess/ purulent sputum or mucus plugs or hemoptysis or cp or chest tightness, subjective wheeze or overt sinus or hb symptoms.   Sleeping as above without nocturnal  or early am exacerbation  of respiratory  c/o's or need for noct saba. Also denies any obvious fluctuation of symptoms with weather or environmental changes or other aggravating or alleviating factors except as outlined above   No unusual exposure hx or h/o childhood pna/ asthma or knowledge of premature birth.  Current Allergies, Complete Past Medical History, Past Surgical History, Family History, and Social History were reviewed in [facility] electronic medical record.  ROS  The following are not active complaints unless bolded Hoarseness, sore throat, dysphagia, dental problems, itching, sneezing,  nasal congestion or discharge of excess mucus or purulent secretions, ear ache,   fever, chills, sweats, unintended wt loss stabilized/reversing now  or wt  gain, classically pleuritic or exertional cp,  orthopnea pnd or arm/hand swelling  or leg swelling, presyncope, palpitations, abdominal pain, anorexia, nausea, vomiting, diarrhea  or change in bowel habits or change in bladder habits, change in stools or change in urine, dysuria, hematuria,  rash, arthralgias, visual complaints, headache, numbness, weakness or ataxia or problems with walking or coordination,  change in mood or  memory.              Past Medical History:  Diagnosis Date   Acute medial meniscus tear     Outpatient Medications Prior to Visit  Medication Sig Dispense Refill   albuterol (VENTOLIN HFA) 108 (90 Base) MCG/ACT inhaler Inhale 2 puffs into the lungs every 4 (four) hours as needed.           Objective:     BP 120/70 (BP Location: Left Arm, Cuff Size: Normal)    Pulse 76    Temp 97.7 F (36.5 C) (Oral)    Ht 6\' 1"  (1.854 m)    Wt 230 lb (104.3 kg)    SpO2 98%    BMI 30.34 kg/m   SpO2: 98 %  RA  amb mildly obese wm nad  HEENT: nl dentition, turbinates bilaterally, and oropharynx. Nl external ear canals without cough reflex   NECK :  without JVD/Nodes/TM/ nl carotid upstrokes bilaterally   LUNGS: no acc muscle use,  Nl contour chest which is clear to A and P bilaterally  without cough on deep insp  Maneuvers at end inspirtation   CV:  RRR  no s3 or murmur or increase in P2, and no edema   ABD:  soft and nontender with nl inspiratory excursion in the supine position. No bruits or organomegaly appreciated, bowel sounds nl  MS:  Nl gait/ ext warm without deformities, calf tenderness, cyanosis or clubbing No obvious joint restrictions   SKIN: warm and dry without lesions    NEURO:  alert, approp, nl sensorium with  no motor or cerebellar deficits apparent.     CXR PA and Lateral:   09/19/2018 :    I personally reviewed images and agree with radiology impression as follows:    Slight right midlung atelectasis. No edema or consolidation.  No Adenopathy. My review:  Tiny area of atx peripherally RML seen on lateral and of no significance   Labs ordered/ reviewed:      Chemistry      Component Value Date/Time   NA 140 09/19/2018 1116   K 3.7 09/19/2018 1116   CL 105 09/19/2018 1116   CO2 29 09/19/2018 1116   BUN 6 09/19/2018 1116   CREATININE 0.99 09/19/2018 1116   CREATININE 1.08 02/06/2017 1611      Component Value Date/Time   CALCIUM 9.3 09/19/2018 1116   ALKPHOS 60 09/19/2018 1116   AST 19 09/19/2018 1116   ALT 31 09/19/2018 1116   BILITOT 0.5 09/19/2018 1116        Lab Results  Component Value Date   WBC 6.6 09/19/2018   HGB 15.2 09/19/2018   HCT 42.6 09/19/2018   MCV 87.9 09/19/2018   PLT 326.0 09/19/2018       EOS                                                               0.2                                    09/19/2018       Lab Results  Component Value Date   PROBNP 17.0 09/19/2018       Lab Results  Component Value Date   ESRSEDRATE 5 09/19/2018   ESRSEDRATE 2 02/06/2017            Assessment   Chronic cough Onset with documented Covid 19 August 01 2018 09/10/2018 SARS-CoV-2  NAA   Not detected  -  09/19/2018  Covid IgG ab Positive   This is a typical post viral pna cough that occurs on inspiration and likely also has a component of Upper airway cough syndrome (previously labeled PNDS),  is so named because it's frequently impossible to sort out how much is  CR/sinusitis with freq throat clearing (which can be related to primary GERD)   vs  causing  secondary (" extra esophageal")  GERD from wide swings in gastric pressure that occur with throat clearing, often  promoting self use of mint and menthol lozenges that reduce the lower esophageal sphincter tone and exacerbate the problem further in a cyclical fashion.   These are the same pts (now being labeled as having "irritable larynx syndrome" by some cough centers) who not infrequently have a  history of having failed to tolerate  ace inhibitors,  dry powder inhalers or biphosphonates or report having atypical/extraesophageal reflux symptoms that don't respond to standard doses of PPI  and are easily confused as having aecopd or asthma flares by even experienced allergists/ pulmonologists (myself included).   rec max rx short term for GERD and just pred x 6 days and then regroup in 2 weeks if not resolved as this should not turn into a persistent cough unless does so in a cyclical manner (cough inducing reflux inducing more cough)  Advised to avoid any heavy fumes or obvious triggers for cough   Ok to resume working     DOE (dyspnea on exertion) Onset with COVID 25 July 2018  - 09/19/2018   Walked RA  2 laps @  approx 236ft each @ fast pace  stopped due to  Min sob/ no desats    Strongly suspect this is a conditioning issue/ rec reconditioning and return for full pfts in 2 weeks if not progressing back to nl     COVID-19 virus infection Advised clinical course typical of recovery from Covid -19 and positive antibodies suggest he is immune to this particular strain/ no further isolation needed       Total time devoted to counseling  > 50 % of initial 60 min office visit:  review case with pt/  directly observed portions of ambulatory 02 saturation study/ discussion of options/alternatives/ personally creating written customized instructions  in presence of pt  then going over those specific  Instructions directly with the pt including how to use all of the meds but in particular covering each new medication in detail and the difference between the maintenance= "automatic" meds and the prns using an action plan format for the latter (If this problem/symptom => do that organization reading Left to right).  Please see AVS from this visit for a full list of these instructions which I personally wrote for this pt and  are unique to this visit.     Sandrea Hughs, MD 09/19/2018

## 2018-09-19 NOTE — Progress Notes (Signed)
Spoke with pt and notified of results per Dr. Wert. Pt verbalized understanding and denied any questions. 

## 2018-09-19 NOTE — Patient Instructions (Signed)
Prilosec otc 20 mg Take 30- 60 min before your first and last meals of the day until cough is gone   GERD (REFLUX)  is an extremely common cause of respiratory symptoms just like yours , many times with no obvious heartburn at all.    It can be treated with medication, but also with lifestyle changes including elevation of the head of your bed (ideally with 6 -8inch blocks under the headboard of your bed),  Smoking cessation, avoidance of late meals, excessive alcohol, and avoid fatty foods, chocolate, peppermint, colas, red wine, and acidic juices such as orange juice.  NO MINT OR MENTHOL PRODUCTS SO NO COUGH DROPS  USE SUGARLESS CANDY INSTEAD (Jolley ranchers or Stover's or Life Savers) or even ice chips will also do - the key is to swallow to prevent all throat clearing. NO OIL BASED VITAMINS - use powdered substitutes.  Avoid fish oil when coughing.    Prednisone 10 mg take  4 each am x 2 days,   2 each am x 2 days,  1 each am x 2 days and stop    Please remember to go to the lab and x-ray department   for your tests - we will call you with the results when they are available.

## 2018-09-20 ENCOUNTER — Encounter: Payer: Self-pay | Admitting: Internal Medicine

## 2018-09-20 DIAGNOSIS — U071 COVID-19: Secondary | ICD-10-CM | POA: Insufficient documentation

## 2018-09-20 LAB — SAR COV2 SEROLOGY (COVID19)AB(IGG),IA: SARS CoV2 AB IGG: POSITIVE — AB

## 2018-09-20 NOTE — Assessment & Plan Note (Signed)
Onset with COVID 25 July 2018  - 09/19/2018   Walked RA  2 laps @  approx 290ft each @ fast pace  stopped due to  Min sob/ no desats    Strongly suspect this is a conditioning issue/ rec reconditioning and return for full pfts in 2 weeks if not progressing back to nl   Total time devoted to counseling  > 50 % of initial 60 min office visit:  review case with pt/  directly observed portions of ambulatory 02 saturation study/ discussion of options/alternatives/ personally creating written customized instructions  in presence of pt  then going over those specific  Instructions directly with the pt including how to use all of the meds but in particular covering each new medication in detail and the difference between the maintenance= "automatic" meds and the prns using an action plan format for the latter (If this problem/symptom => do that organization reading Left to right).  Please see AVS from this visit for a full list of these instructions which I personally wrote for this pt and  are unique to this visit.

## 2018-09-20 NOTE — Assessment & Plan Note (Signed)
Advised clinical course typical of recovery from Covid -19 and positive antibodies suggest he is immune to this particular strain/ no further isolation needed

## 2018-09-20 NOTE — Assessment & Plan Note (Addendum)
Onset with documented Covid 19 August 01 2018 09/10/2018 SARS-CoV-2  NAA   Not detected  -  09/19/2018  Covid IgG ab Positive   This is a typical post viral pna cough that occurs on inspiration and likely also has a component of Upper airway cough syndrome (previously labeled PNDS),  is so named because it's frequently impossible to sort out how much is  CR/sinusitis with freq throat clearing (which can be related to primary GERD)   vs  causing  secondary (" extra esophageal")  GERD from wide swings in gastric pressure that occur with throat clearing, often  promoting self use of mint and menthol lozenges that reduce the lower esophageal sphincter tone and exacerbate the problem further in a cyclical fashion.   These are the same pts (now being labeled as having "irritable larynx syndrome" by some cough centers) who not infrequently have a history of having failed to tolerate ace inhibitors,  dry powder inhalers or biphosphonates or report having atypical/extraesophageal reflux symptoms that don't respond to standard doses of PPI  and are easily confused as having aecopd or asthma flares by even experienced allergists/ pulmonologists (myself included).   rec max rx short term for GERD and just pred x 6 days and then regroup in 2 weeks if not resolved as this should not turn into a persistent cough unless does so in a cyclical manner (cough inducing reflux inducing more cough)  Advised to avoid any heavy fumes or obvious triggers for cough   Ok to resume working

## 2018-09-20 NOTE — Progress Notes (Signed)
Spoke with pt and notified of results per Dr. Wert. Pt verbalized understanding and denied any questions. 

## 2020-06-25 ENCOUNTER — Other Ambulatory Visit: Payer: Self-pay | Admitting: Family

## 2020-06-25 DIAGNOSIS — R1032 Left lower quadrant pain: Secondary | ICD-10-CM

## 2020-06-30 ENCOUNTER — Ambulatory Visit: Admission: RE | Admit: 2020-06-30 | Payer: 59 | Source: Ambulatory Visit

## 2020-07-05 ENCOUNTER — Other Ambulatory Visit: Payer: Self-pay

## 2020-07-05 ENCOUNTER — Ambulatory Visit
Admission: RE | Admit: 2020-07-05 | Discharge: 2020-07-05 | Disposition: A | Discharge status: Home / Self Care | Payer: 59 | Source: Ambulatory Visit | Attending: Family | Admitting: Family

## 2020-07-05 DIAGNOSIS — R1032 Left lower quadrant pain: Secondary | ICD-10-CM | POA: Insufficient documentation

## 2020-12-17 ENCOUNTER — Other Ambulatory Visit: Payer: Self-pay

## 2020-12-17 ENCOUNTER — Encounter: Payer: Self-pay | Admitting: Emergency Medicine

## 2020-12-17 ENCOUNTER — Ambulatory Visit
Admission: EM | Admit: 2020-12-17 | Discharge: 2020-12-17 | Disposition: A | Discharge status: Home / Self Care | Payer: 59 | Attending: Emergency Medicine | Admitting: Emergency Medicine

## 2020-12-17 DIAGNOSIS — R509 Fever, unspecified: Secondary | ICD-10-CM

## 2020-12-17 DIAGNOSIS — R0789 Other chest pain: Secondary | ICD-10-CM

## 2020-12-17 DIAGNOSIS — Z8616 Personal history of COVID-19: Secondary | ICD-10-CM

## 2020-12-17 DIAGNOSIS — B349 Viral infection, unspecified: Secondary | ICD-10-CM

## 2020-12-17 MED ORDER — PREDNISONE 10 MG PO TABS: ORAL_TABLET | ORAL | 0 refills | Status: AC

## 2020-12-17 MED ORDER — PSEUDOEPH-BROMPHEN-DM 30-2-10 MG/5ML PO SYRP: 5.0000 mL | ORAL_SOLUTION | Freq: Three times a day (TID) | ORAL | 0 refills | Status: DC | PRN

## 2020-12-17 MED ORDER — ALBUTEROL SULFATE HFA 108 (90 BASE) MCG/ACT IN AERS: 1.0000 | INHALATION_SPRAY | Freq: Four times a day (QID) | RESPIRATORY_TRACT | 0 refills | Status: DC | PRN

## 2020-12-17 NOTE — Discharge Instructions (Addendum)
Use your cough syrup, albuterol inhaler, low-dose steroid taper as prescribed.  We will call you with any positive results from your COVID-19/Influenza testing completed in clinic today.  If you do not receive a phone call from [place] within the next 2-3 days, check your MyChart for up-to-date health information related to testing completed in clinic today.  For most people this is a self-limiting process and can take anywhere from 7 - 10 days to start feeling better. A cough can last up to 3 weeks. Pay special attention to handwashing as this can help prevent the spread of the virus.   Always read the labels of cough and cold medications as they may contain some of the ingredients below.  Rest, push lots of fluids (especially water), and utilize supportive care for symptoms. You may take acetaminophen (Tylenol) every 4-6 hours and ibuprofen every 6-8 hours for muscle pain, joint pain, headaches (you may also alternate these medications). Mucinex (guaifenesin) may be taken over the counter for cough as needed can loosen phlegm. Please read the instructions and take as directed.  Sudafed (pseudophedrine) is sold behind the counter and can help reduce nasal pressure; avoid taking this if you have high blood pressure or feel jittery. Sudafed PE (phenylephrine) can be a helpful, short-term, over-the-counter alternative to limit side effects or if you have high blood pressure.  Flonase nasal spray can help alleviate congestion and sinus pressure. Many patients choose Afrin as a nasal decongestant; do not use for more than 3 days for risk of rebound (increased symptoms after stopping medication).  Saline nasal sprays or rinses can also help nasal congestion (use bottled or sterile water). Warm tea with lemon and honey can sooth sore throat and cough, as can cough drops.   Return to clinic for high fever not improving with medications, chest pain, difficulty breathing, non-stop vomiting, or coughing blood.  Follow-up with your primary care provider if symptoms do not improve as expected in the next 5-7 days.

## 2020-12-17 NOTE — ED Triage Notes (Addendum)
Patient c/o fever and productive cough x 2 days.   Patient endorses a temperature of 103 F at home.   Patient endorses "burning in chest" with inhalation.   Patient reports taking an at home COVID test with negative results.   Patient endorses generalized body aches. Patient endorses diarrhea yesterday.   Patient denies N/V.   Patient has taken Tylenol and Mucinex w/ some relief of fever per patient statement.

## 2020-12-17 NOTE — ED Provider Notes (Signed)
CHIEF COMPLAINT:   Chief Complaint  Patient presents with   Fever   Cough     SUBJECTIVE/HPI:   Fever Associated symptoms: cough   Cough Associated symptoms: fever   A very pleasant 25 y.o.Male presents today with fever and cough that started 2 days ago.  Patient reports that the cough is productive, but states that he has not been looking at his sputum.  Patient endorses generalized body aches and diarrhea.  Patient reports having diarrhea yesterday.  He reports no nausea or vomiting.  He reports that he does work with the public, but does not report any known COVID-19 exposure.  Patient states that on Wednesday he had a meeting in [place] with about 60 people.  Patient states that he had COVID-19 about 3 years ago which gave him some "damage to his lungs".  Patient states that he has not been vaccinated against COVID-19 or influenza.  Patient endorses that he had a T-max of 39 F at home.  He has taken some Tylenol and Mucinex with some mild relief of symptoms to include fever.  He is also reporting some chest tightness. Patient does not report any shortness of breath, chest pain, palpitations, visual changes, weakness, tingling, headache, nausea, vomiting, chills.   has a past medical history of Acute medial meniscus tear.  ROS:  Review of Systems  Constitutional:  Positive for fever.  Respiratory:  Positive for cough.   See Subjective/HPI Medications, Allergies and Problem List personally reviewed in Epic today OBJECTIVE:   Vitals:   12/17/20 0902  BP: 115/73  Pulse: 93  Resp: 18  Temp: 99 F (37.2 C)  SpO2: 95%    Physical Exam   General: Appears well-developed and well-nourished. No acute distress.  HEENT Head: Normocephalic and atraumatic. Ears: Hearing grossly intact, no drainage or visible deformity.  Nose: No nasal deviation. Mouth/Throat: No stridor or tracheal deviation.  Non erythematous posterior pharynx noted with clear drainage present.  No white patchy  exudate noted. Eyes: Conjunctivae and EOM are normal. No eye drainage or scleral icterus bilaterally.  Neck: Normal range of motion, neck is supple. No cervical, tonsillar or submandibular lymph nodes palpated.   Cardiovascular: Normal rate. Regular rhythm; no murmurs, gallops, or rubs.  Pulm/Chest: No respiratory distress. Breath sounds normal bilaterally without wheezes, rhonchi, or rales.  Intermittent forceful cough noted. Neurological: Alert and oriented to person, place, and time.  Skin: Skin is warm and dry.  No rashes, lesions, abrasions or bruising noted to skin.   Psychiatric: Normal mood, affect, behavior, and thought content.   Vital signs and nursing note reviewed.   Patient stable and cooperative with examination. PROCEDURES:    LABS/X-RAYS/EKG/MEDS:   No results found for any visits on 12/17/20.  MEDICAL DECISION MAKING:   Patient presents with fever and cough that started 2 days ago.  Patient reports that the cough is productive, but states that he has not been looking at his sputum.  Patient endorses generalized body aches and diarrhea.  Patient reports having diarrhea yesterday.  He reports no nausea or vomiting.  He reports that he does work with the public, but does not report any known COVID-19 exposure.  Patient states that on Wednesday he had a meeting in [place] with about 60 people.  Patient states that he had COVID-19 about 3 years ago which gave him some "damage to his lungs".  Patient states that he has not been vaccinated against COVID-19 or influenza.  Patient endorses that he had a  T-max of 103 F at home.  He has taken some Tylenol and Mucinex with some mild relief of symptoms to include fever.  He is also reporting some chest tightness. Patient does not report any shortness of breath, chest pain, palpitations, visual changes, weakness, tingling, headache, nausea, vomiting, chills.  Chart review completed.  Given symptoms along with assessment findings, likely  viral illness.  Did obtain a COVID-19/influenza swab which is pending.  Rx'd a low-dose prednisone taper along with Bromfed cough syrup and an albuterol inhaler to help with symptom management and chest tightness.  No concern for ACS at this time.  Also, no concern for pneumonia.  Advised of at home treatment and care to include rest, continued Mucinex, Tylenol versus ibuprofen and increasing fluids.  Advised of strict return precautions for worsening of symptoms.  Return as needed.  Patient verbalized understanding and agreed with treatment plan.  Patient stable upon discharge. ASSESSMENT/PLAN:  1. Viral illness - Covid-19, Flu A+B ([facility]); Standing - Covid-19, Flu A+B ([facility]) - predniSONE (DELTASONE) 10 MG tablet; Take 3 tablets (30 mg total) by mouth daily with breakfast for 2 days, THEN 2 tablets (20 mg total) daily with breakfast for 2 days, THEN 1 tablet (10 mg total) daily with breakfast for 2 days.  Dispense: 12 tablet; Refill: 0 - brompheniramine-pseudoephedrine-DM 30-2-10 MG/5ML syrup; Take 5 mLs by mouth 3 (three) times daily as needed.  Dispense: 120 mL; Refill: 0 - albuterol (VENTOLIN HFA) 108 (90 Base) MCG/ACT inhaler; Inhale 1-2 puffs into the lungs every 6 (six) hours as needed (cough).  Dispense: 6.7 g; Refill: 0  2. Chest tightness  3. History of COVID-19  Instructions about new medications and side effects provided.  Plan:   Discharge Instructions      Use your cough syrup, albuterol inhaler, low-dose steroid taper as prescribed.  We will call you with any positive results from your COVID-19/Influenza testing completed in clinic today.  If you do not receive a phone call from [place] within the next 2-3 days, check your MyChart for up-to-date health information related to testing completed in clinic today.  For most people this is a self-limiting process and can take anywhere from 7 - 10 days to start feeling better. A cough can last up to 3 weeks. Pay special attention  to handwashing as this can help prevent the spread of the virus.   Always read the labels of cough and cold medications as they may contain some of the ingredients below.  Rest, push lots of fluids (especially water), and utilize supportive care for symptoms. You may take acetaminophen (Tylenol) every 4-6 hours and ibuprofen every 6-8 hours for muscle pain, joint pain, headaches (you may also alternate these medications). Mucinex (guaifenesin) may be taken over the counter for cough as needed can loosen phlegm. Please read the instructions and take as directed.  Sudafed (pseudophedrine) is sold behind the counter and can help reduce nasal pressure; avoid taking this if you have high blood pressure or feel jittery. Sudafed PE (phenylephrine) can be a helpful, short-term, over-the-counter alternative to limit side effects or if you have high blood pressure.  Flonase nasal spray can help alleviate congestion and sinus pressure. Many patients choose Afrin as a nasal decongestant; do not use for more than 3 days for risk of rebound (increased symptoms after stopping medication).  Saline nasal sprays or rinses can also help nasal congestion (use bottled or sterile water). Warm tea with lemon and honey can sooth sore throat and cough,  as can cough drops.   Return to clinic for high fever not improving with medications, chest pain, difficulty breathing, non-stop vomiting, or coughing blood. Follow-up with your primary care provider if symptoms do not improve as expected in the next 5-7 days.         Amalia Greenhouse, FNP 12/17/20 1014

## 2020-12-19 LAB — COVID-19, FLU A+B NAA
Influenza A, NAA: NOT DETECTED
Influenza B, NAA: NOT DETECTED
SARS-CoV-2, NAA: NOT DETECTED

## 2021-02-07 IMAGING — DX CHEST - 2 VIEW
2 series · 2 of 2 positions shown · non-contrast
Comparison: None.

CLINICAL DATA: Cough

EXAM:
CHEST - 2 VIEW

[chest pa]
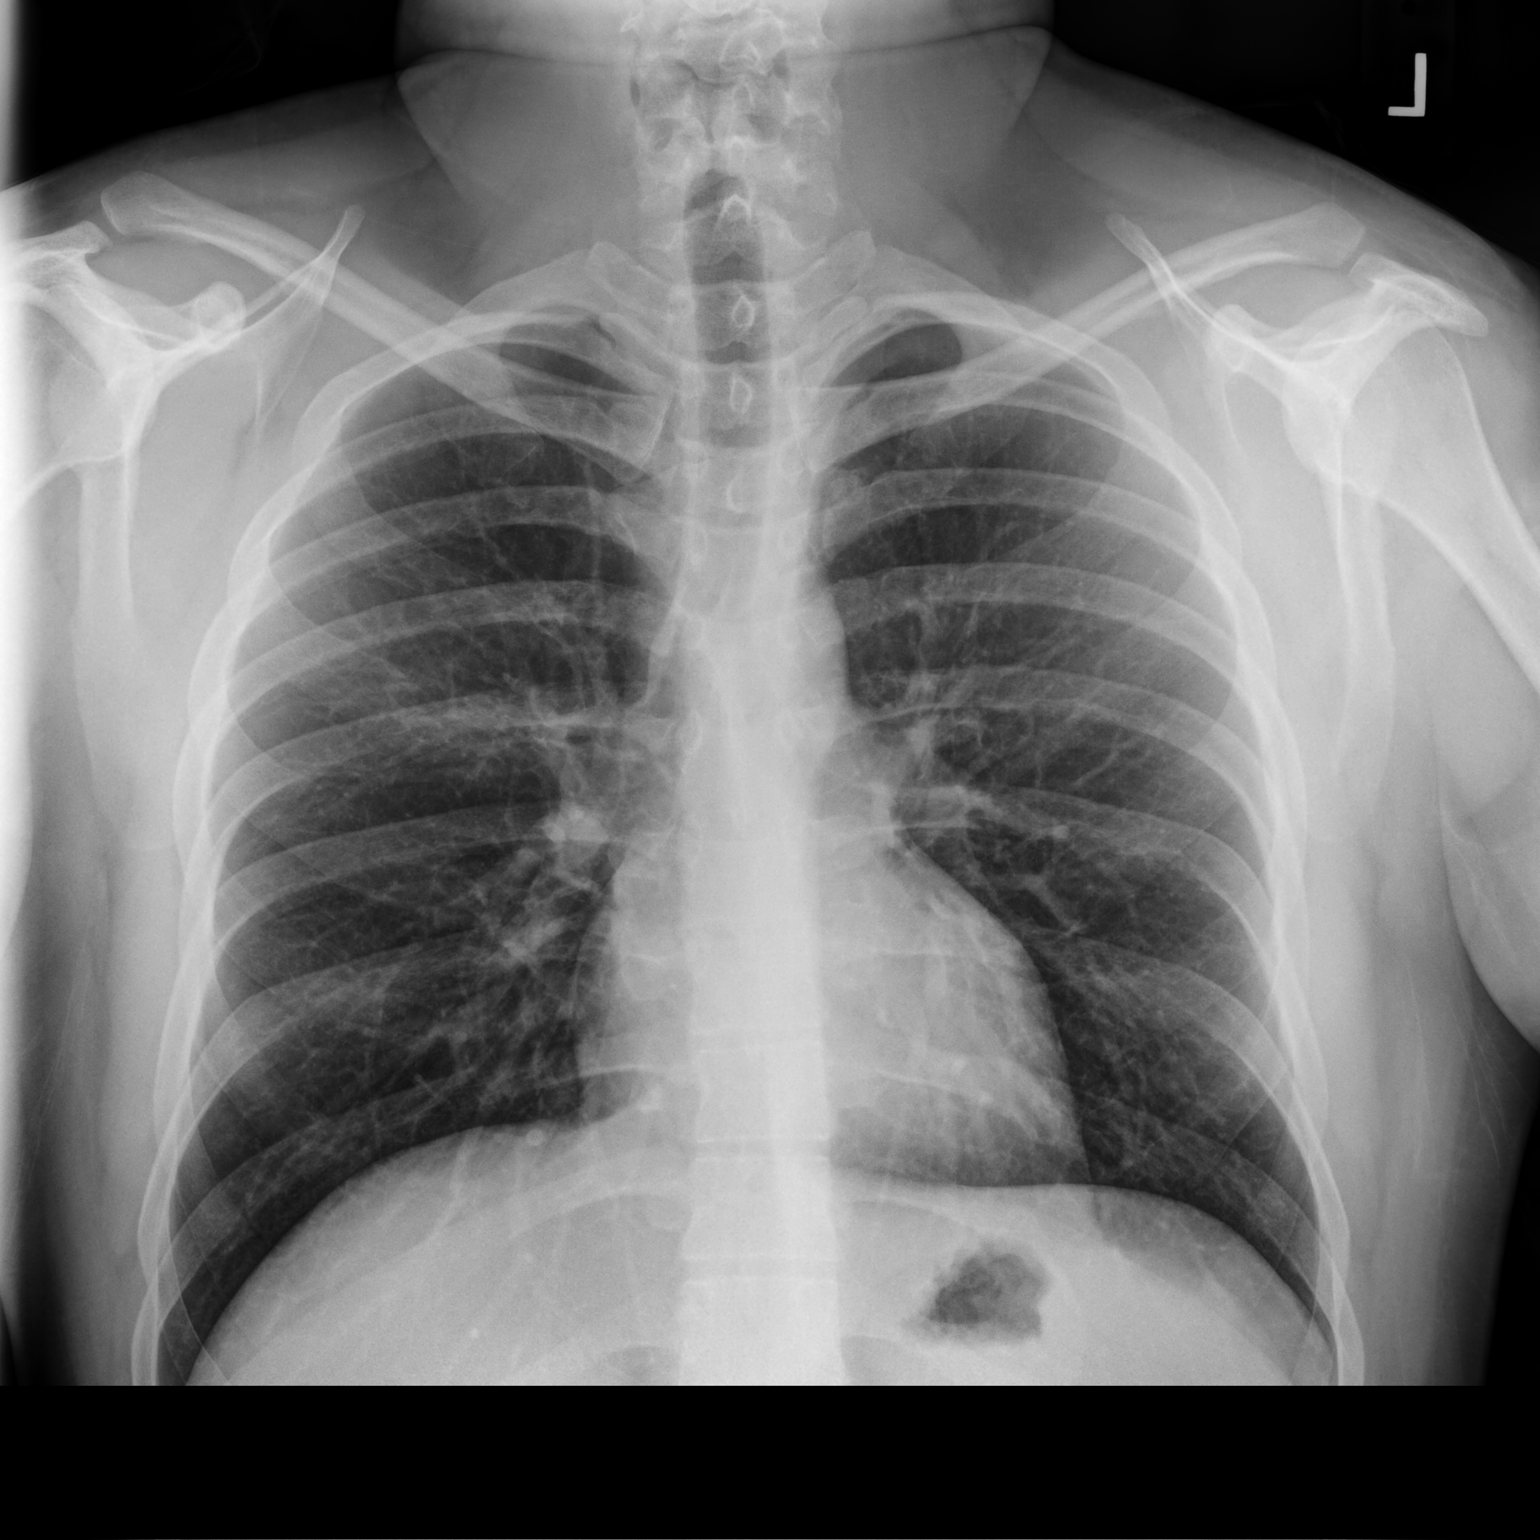

[chest lat]
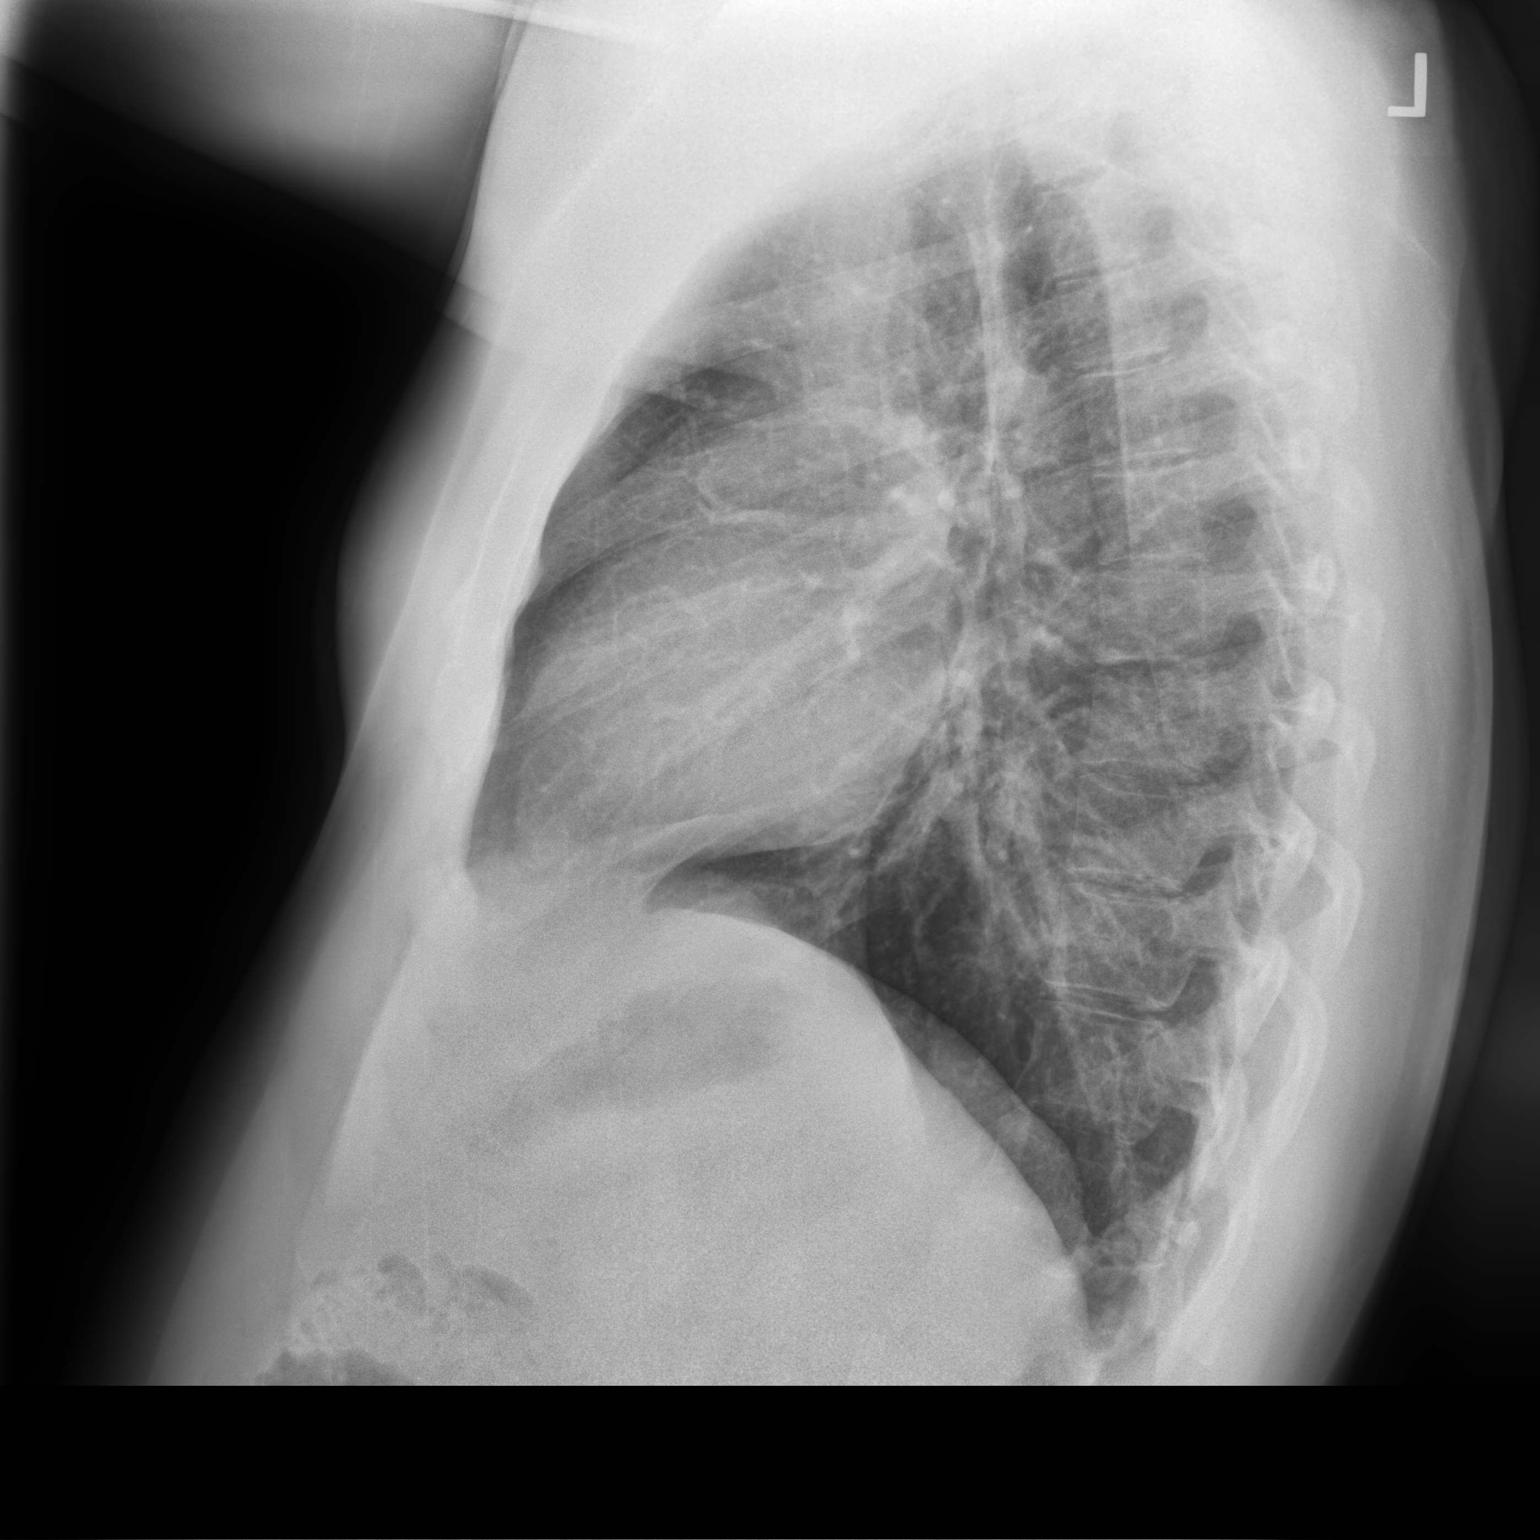

[2 of 2 positions shown; findings below may reference images not displayed]

FINDINGS: There is slight right midlung atelectasis. The lungs elsewhere are
clear. Heart size and pulmonary vascularity are normal. No
adenopathy. No bone lesions.
IMPRESSION: Slight right midlung atelectasis. No edema or consolidation. No
adenopathy.

## 2021-03-17 ENCOUNTER — Ambulatory Visit: Payer: 59 | Admitting: Podiatry

## 2021-03-17 ENCOUNTER — Encounter: Payer: Self-pay | Admitting: Podiatry

## 2021-03-17 ENCOUNTER — Ambulatory Visit (INDEPENDENT_AMBULATORY_CARE_PROVIDER_SITE_OTHER): Payer: 59

## 2021-03-17 ENCOUNTER — Other Ambulatory Visit: Payer: Self-pay

## 2021-03-17 DIAGNOSIS — M722 Plantar fascial fibromatosis: Secondary | ICD-10-CM

## 2021-03-17 DIAGNOSIS — M79672 Pain in left foot: Secondary | ICD-10-CM

## 2021-03-18 ENCOUNTER — Encounter: Payer: Self-pay | Admitting: Podiatry

## 2021-03-18 NOTE — Progress Notes (Signed)
  Subjective:  Patient ID: Roy Huffman, male    DOB: 10-14-1995,  MRN: [id number]  Chief Complaint  Patient presents with   Foot Pain    Left heel pain for about a year pT stated that on Sunday the pain got pretty bad he was unable to put weight on the heel     25  y.o. male presents with the above complaint.  Patient presents with complaint left heel pain.  Patient states is painful to touch painful to ambulate on.  Is been going for about a year has progressed to gotten worse since last Sunday.  Cannot put any weight on the foot.  He has not seen anyone else prior to seeing me.  He denies any other acute complaints.  He would like to discuss treatment options for this.  He is tried resting it which has not helped much.  He is on his feet for a long period of time.   Review of Systems: Negative except as noted in the HPI. Denies N/V/F/Ch.  Past Medical History:  Diagnosis Date   Acute medial meniscus tear     Current Outpatient Medications:    albuterol (VENTOLIN HFA) 108 (90 Base) MCG/ACT inhaler, Inhale 1-2 puffs into the lungs every 6 (six) hours as needed (cough)., Disp: 6.7 g, Rfl: 0   brompheniramine-pseudoephedrine-DM 30-2-10 MG/5ML syrup, Take 5 mLs by mouth 3 (three) times daily as needed., Disp: 120 mL, Rfl: 0   omeprazole (PRILOSEC OTC) 20 MG tablet, Take 30- 60 min before your first and last meals of the day, Disp: , Rfl:   Social History   Tobacco Use  Smoking Status Former   Packs/day: 0.50   Years: 4.00   Pack years: 2.00   Types: Cigarettes   Quit date: 05/08/2013   Years since quitting: 7.8  Smokeless Tobacco Never    No Known Allergies Objective:  There were no vitals filed for this visit. There is no height or weight on file to calculate BMI. Constitutional Well developed. Well nourished.  Vascular Dorsalis pedis pulses palpable bilaterally. Posterior tibial pulses palpable bilaterally. Capillary refill normal to all digits.  No cyanosis or clubbing  noted. Pedal hair growth normal.  Neurologic Normal speech. Oriented to person, place, and time. Epicritic sensation to light touch grossly present bilaterally.  Dermatologic Nails well groomed and normal in appearance. No open wounds. No skin lesions.  Orthopedic: Normal joint ROM without pain or crepitus bilaterally. No visible deformities. Tender to palpation at the calcaneal tuber left. No pain with calcaneal squeeze left. Ankle ROM diminished range of motion left. Silfverskiold Test: positive left.   Radiographs: Taken and reviewed. No acute fractures or dislocations. No evidence of stress fracture.  Plantar heel spur present. Posterior heel spur absent.   Assessment:   1. Plantar fasciitis of left foot    Plan:  Patient was evaluated and treated and all questions answered.  Plantar Fasciitis, left - XR reviewed as above.  - Educated on icing and stretching. Instructions given.  - Injection delivered to the plantar fascia as below. - DME: Plantar Fascial Brace - Pharmacologic management: None  Procedure: Injection Tendon/Ligament Location: Left plantar fascia at the glabrous junction; medial approach. Skin Prep: alcohol Injectate: 0.5 cc 0.5% marcaine plain, 0.5 cc of 1% Lidocaine, 0.5 cc kenalog 10. Disposition: Patient tolerated procedure well. Injection site dressed with a band-aid.  No follow-ups on file.

## 2021-03-24 ENCOUNTER — Ambulatory Visit: Payer: 59 | Admitting: Podiatry

## 2021-04-14 ENCOUNTER — Other Ambulatory Visit: Payer: Self-pay

## 2021-04-14 ENCOUNTER — Ambulatory Visit: Payer: 59 | Admitting: Podiatry

## 2021-04-14 DIAGNOSIS — Q666 Other congenital valgus deformities of feet: Secondary | ICD-10-CM

## 2021-04-14 DIAGNOSIS — M722 Plantar fascial fibromatosis: Secondary | ICD-10-CM | POA: Diagnosis not present

## 2021-04-21 ENCOUNTER — Encounter: Payer: Self-pay | Admitting: Podiatry

## 2021-04-21 NOTE — Progress Notes (Signed)
Subjective:  Patient ID: Roy Huffman, male    DOB: August 22, 1995,  MRN: [id number]  Chief Complaint  Patient presents with   Plantar Fasciitis    Pt stated that his foot is doing better     25 y.o. male presents with the above complaint.  Patient presents with follow-up of pes planovalgus.  Patient states the foot is doing much better after the injection.  He is about 7080% improved.  He states it still hurts to ambulate on.  He still has some residual pain.  He would like to discuss orthotics as well.   Review of Systems: Negative except as noted in the HPI. Denies N/V/F/Ch.  Past Medical History:  Diagnosis Date   Acute medial meniscus tear     Current Outpatient Medications:    albuterol (VENTOLIN HFA) 108 (90 Base) MCG/ACT inhaler, Inhale 1-2 puffs into the lungs every 6 (six) hours as needed (cough)., Disp: 6.7 g, Rfl: 0   brompheniramine-pseudoephedrine-DM 30-2-10 MG/5ML syrup, Take 5 mLs by mouth 3 (three) times daily as needed., Disp: 120 mL, Rfl: 0   omeprazole (PRILOSEC OTC) 20 MG tablet, Take 30- 60 min before your first and last meals of the day, Disp: , Rfl:   Social History   Tobacco Use  Smoking Status Former   Packs/day: 0.50   Years: 4.00   Pack years: 2.00   Types: Cigarettes   Quit date: 05/08/2013   Years since quitting: 7.9  Smokeless Tobacco Never    No Known Allergies Objective:  There were no vitals filed for this visit. There is no height or weight on file to calculate BMI. Constitutional Well developed. Well nourished.  Vascular Dorsalis pedis pulses palpable bilaterally. Posterior tibial pulses palpable bilaterally. Capillary refill normal to all digits.  No cyanosis or clubbing noted. Pedal hair growth normal.  Neurologic Normal speech. Oriented to person, place, and time. Epicritic sensation to light touch grossly present bilaterally.  Dermatologic Nails well groomed and normal in appearance. No open wounds. No skin lesions.  Orthopedic:  Normal joint ROM without pain or crepitus bilaterally. No visible deformities. Tender to palpation at the calcaneal tuber left. No pain with calcaneal squeeze left. Ankle ROM diminished range of motion left. Silfverskiold Test: positive left.   Radiographs: Taken and reviewed. No acute fractures or dislocations. No evidence of stress fracture.  Plantar heel spur present. Posterior heel spur absent.   Assessment:   1. Pes planovalgus   2. Plantar fasciitis of left foot     Plan:  Patient was evaluated and treated and all questions answered.  Plantar Fasciitis, left - XR reviewed as above.  - Educated on icing and stretching. Instructions given.  -Second injection delivered to the plantar fascia as below. - DME: Plantar Fascial Brace - Pharmacologic management: None  Pes planovalgus -I explained to patient the etiology of pes planovalgus and relationship with Planter fasciitis and various treatment options were discussed.  Given patient foot structure in the setting of Planter fasciitis I believe patient will benefit from custom-made orthotics to help control the hindfoot motion support the arch of the foot and take the stress away from plantar fascial.  Patient agrees with the plan like to proceed with orthotics -Patient was casted for orthotics   Procedure: Injection Tendon/Ligament Location: Left plantar fascia at the glabrous junction; medial approach. Skin Prep: alcohol Injectate: 0.5 cc 0.5% marcaine plain, 0.5 cc of 1% Lidocaine, 0.5 cc kenalog 10. Disposition: Patient tolerated procedure well. Injection site dressed with a  band-aid.  No follow-ups on file.

## 2021-05-12 ENCOUNTER — Encounter: Payer: Self-pay | Admitting: Podiatry

## 2021-05-12 ENCOUNTER — Other Ambulatory Visit: Payer: Self-pay

## 2021-05-12 ENCOUNTER — Ambulatory Visit: Payer: 59 | Admitting: Podiatry

## 2021-05-12 DIAGNOSIS — M722 Plantar fascial fibromatosis: Secondary | ICD-10-CM

## 2021-05-12 NOTE — Progress Notes (Signed)
Subjective:  Patient ID: Roy Huffman, male    DOB: [birth year],  MRN: [id number]  Chief Complaint  Patient presents with   Plantar Fasciitis    "It's mostly better, bothers me in the morning."    26 y.o. male presents with the above complaint.  Patient presents with follow-up of Planter fasciitis patient states is still hurting and having residual pain.  The previous injection did not help.  He would like to discuss other treatment options.  He does not want any more injection and does not want going to be because he cannot take time off of work.  He denies any other acute complaints   Review of Systems: Negative except as noted in the HPI. Denies N/V/F/Ch.  Past Medical History:  Diagnosis Date   Acute medial meniscus tear     Current Outpatient Medications:    albuterol (VENTOLIN HFA) 108 (90 Base) MCG/ACT inhaler, Inhale 1-2 puffs into the lungs every 6 (six) hours as needed (cough)., Disp: 6.7 g, Rfl: 0   brompheniramine-pseudoephedrine-DM 30-2-10 MG/5ML syrup, Take 5 mLs by mouth 3 (three) times daily as needed., Disp: 120 mL, Rfl: 0   omeprazole (PRILOSEC OTC) 20 MG tablet, Take 30- 60 min before your first and last meals of the day, Disp: , Rfl:   Social History   Tobacco Use  Smoking Status Former   Packs/day: 0.50   Years: 4.00   Pack years: 2.00   Types: Cigarettes   Quit date: 05/08/2013   Years since quitting: 8.0  Smokeless Tobacco Never    No Known Allergies Objective:  There were no vitals filed for this visit. There is no height or weight on file to calculate BMI. Constitutional Well developed. Well nourished.  Vascular Dorsalis pedis pulses palpable bilaterally. Posterior tibial pulses palpable bilaterally. Capillary refill normal to all digits.  No cyanosis or clubbing noted. Pedal hair growth normal.  Neurologic Normal speech. Oriented to person, place, and time. Epicritic sensation to light touch grossly present bilaterally.  Dermatologic Nails  well groomed and normal in appearance. No open wounds. No skin lesions.  Orthopedic: Normal joint ROM without pain or crepitus bilaterally. No visible deformities. Tender to palpation at the calcaneal tuber left. No pain with calcaneal squeeze left. Ankle ROM diminished range of motion left. Silfverskiold Test: positive left.   Radiographs: Taken and reviewed. No acute fractures or dislocations. No evidence of stress fracture.  Plantar heel spur present. Posterior heel spur absent.   Assessment:   1. Plantar fasciitis of left foot      Plan:  Patient was evaluated and treated and all questions answered.  Plantar Fasciitis, left - XR reviewed as above.  - Educated on icing and stretching. Instructions given.  -I will hold off on any further injection as to injection has not helped. - DME: Plantar Fascial Brace - Pharmacologic management: None -We will try shoe gear modification orthotics for now.  If there is no resolve meant of asked him to come see me and we can place him in the cam boot.  Due to work he is unable to wear a boot for now.  Pes planovalgus -I explained to patient the etiology of pes planovalgus and relationship with Planter fasciitis and various treatment options were discussed.  Given patient foot structure in the setting of Planter fasciitis I believe patient will benefit from custom-made orthotics to help control the hindfoot motion support the arch of the foot and take the stress away from plantar fascial.  Patient  agrees with the plan like to proceed with orthotics -Patient is awaiting for orthotics   No follow-ups on file.

## 2021-05-13 ENCOUNTER — Encounter: Payer: Self-pay | Admitting: Podiatry

## 2021-05-16 ENCOUNTER — Telehealth: Payer: Self-pay | Admitting: Podiatry

## 2021-05-16 NOTE — Telephone Encounter (Signed)
Orthotics in [place] to be taken to [facility].. lvm for pt to call to schedule an appt to pick them up, needs a [place] appt.

## 2021-05-19 ENCOUNTER — Ambulatory Visit
Admission: RE | Admit: 2021-05-19 | Discharge: 2021-05-19 | Disposition: A | Discharge status: Home / Self Care | Payer: 59 | Source: Ambulatory Visit | Attending: Internal Medicine | Admitting: Internal Medicine

## 2021-05-19 VITALS — BP 134/77 | HR 106 | Temp 99.4°F | Resp 16

## 2021-05-19 DIAGNOSIS — J039 Acute tonsillitis, unspecified: Secondary | ICD-10-CM

## 2021-05-19 LAB — POCT RAPID STREP A (OFFICE): Rapid Strep A Screen: NEGATIVE

## 2021-05-19 MED ORDER — AZITHROMYCIN 500 MG PO TABS: 500.0000 mg | ORAL_TABLET | Freq: Every day | ORAL | 0 refills | Status: DC

## 2021-05-19 MED ORDER — PREDNISONE 20 MG PO TABS: 20.0000 mg | ORAL_TABLET | Freq: Every day | ORAL | 0 refills | Status: DC

## 2021-05-19 NOTE — ED Triage Notes (Signed)
Pt presents with ST and fever that started yesterday.

## 2021-05-19 NOTE — ED Provider Notes (Addendum)
Roy Huffman    CSN: [id number] Arrival date & time: 05/19/21  1754      History   Chief Complaint Chief Complaint  Patient presents with   Sore Throat   Fever    HPI Roy Huffman is a 26 y.o. male who presents with onset of ST and fever of 101 Since yesterday. Does have body aches. Denies rhinitis or cough. This is the 3rd time he has had this in 1 year. Has pain on L throat more than R.     Past Medical History:  Diagnosis Date   Acute medial meniscus tear     Patient Active Problem List   Diagnosis Date Noted   COVID-19 virus infection 09/20/2018   DOE (dyspnea on exertion) 09/19/2018   Chronic cough 09/18/2018    History reviewed. No pertinent surgical history.     Home Medications    Prior to Admission medications   Medication Sig Start Date End Date Taking? Authorizing Provider  azithromycin (ZITHROMAX) 500 MG tablet Take 1 tablet (500 mg total) by mouth daily. 05/19/21  Yes Rodriguez-Southworth, Nettie Elm, PA-C  predniSONE (DELTASONE) 20 MG tablet Take 1 tablet (20 mg total) by mouth daily with breakfast. 05/19/21  Yes Rodriguez-Southworth, Nettie Elm, PA-C  omeprazole (PRILOSEC OTC) 20 MG tablet Take 30- 60 min before your first and last meals of the day 09/19/18   Nyoka Cowden, MD    Family History Family History  Problem Relation Age of Onset   Lung disease Neg Hx     Social History Social History   Tobacco Use   Smoking status: Former    Packs/day: 0.50    Years: 4.00    Pack years: 2.00    Types: Cigarettes    Quit date: 05/08/2013    Years since quitting: 8.0   Smokeless tobacco: Never  Vaping Use   Vaping Use: Every day   Start date: 05/08/2013   Substances: Nicotine, Flavoring  Substance Use Topics   Alcohol use: Yes    Comment: once a week   Drug use: No     Allergies   Patient has no known allergies.   Review of Systems Review of Systems  Constitutional:  Positive for appetite change, chills and fever.  HENT:   Positive for sore throat. Negative for congestion, ear discharge, ear pain and rhinorrhea.   Respiratory:  Negative for cough.   Hematological:  Negative for adenopathy.    Physical Exam Triage Vital Signs ED Triage Vitals  Enc Vitals Group     BP 05/19/21 1804 134/77     Pulse Rate 05/19/21 1804 (!) 106     Resp 05/19/21 1804 16     Temp 05/19/21 1804 99.4 F (37.4 C)     Temp Source 05/19/21 1804 Oral     SpO2 05/19/21 1804 95 %     Weight --      Height --      Head Circumference --      Peak Flow --      Pain Score 05/19/21 1800 6     Pain Loc --      Pain Edu? --      Excl. in GC? --    No data found.  Updated Vital Signs BP 134/77 (BP Location: Left Arm)    Pulse (!) 106    Temp 99.4 F (37.4 C) (Oral)    Resp 16    SpO2 95%   Visual Acuity Right Eye Distance:  Left Eye Distance:   Bilateral Distance:    Right Eye Near:   Left Eye Near:    Bilateral Near:     Physical Exam Vitals and nursing note reviewed.  Constitutional:      General: He is not in acute distress.    Appearance: He is obese.  HENT:     Right Ear: Tympanic membrane and ear canal normal.     Left Ear: Tympanic membrane and ear canal normal.     Mouth/Throat:     Mouth: Mucous membranes are moist. No oral lesions.     Pharynx: Uvula midline. Oropharyngeal exudate and posterior oropharyngeal erythema present. No uvula swelling.     Tonsils: Tonsillar exudate present. No tonsillar abscesses. 2+ on the right. 2+ on the left.  Eyes:     Conjunctiva/sclera: Conjunctivae normal.  Cardiovascular:     Rate and Rhythm: Normal rate and regular rhythm.     Heart sounds: No murmur heard. Pulmonary:     Effort: Pulmonary effort is normal.     Breath sounds: Normal breath sounds.  Musculoskeletal:     Cervical back: Normal range of motion.  Lymphadenopathy:     Cervical: Cervical adenopathy present.  Skin:    General: Skin is warm and dry.  Neurological:     Mental Status: He is alert and  oriented to person, place, and time.  Psychiatric:        Mood and Affect: Mood normal.        Behavior: Behavior normal.     UC Treatments / Results  Labs (all labs ordered are listed, but only abnormal results are displayed) Labs Reviewed  POCT RAPID STREP A (OFFICE)   Strep test is negative  EKG   Radiology No results found.  Procedures Procedures (including critical care time)  Medications Ordered in UC Medications - No data to display  Initial Impression / Assessment and Plan / UC Course  I have reviewed the triage vital signs and the nursing notes. Pertinent labs results that were available during my care of the patient were reviewed by me and considered in my medical decision making (see chart for details). Exudative tonsillitis Throat culture was sent out and we will call him if we need to change his medication. I placed him on Azithromycin and prednisone as noted.  May FU with ENT Final Clinical Impressions(s) / UC Diagnoses   Final diagnoses:  Exudative tonsillitis   Discharge Instructions   None    ED Prescriptions     Medication Sig Dispense Auth. Provider   predniSONE (DELTASONE) 20 MG tablet Take 1 tablet (20 mg total) by mouth daily with breakfast. 3 tablet Rodriguez-Southworth, Nettie Elm, PA-C   azithromycin (ZITHROMAX) 500 MG tablet Take 1 tablet (500 mg total) by mouth daily. 5 tablet Rodriguez-Southworth, Nettie Elm, PA-C      PDMP not reviewed this encounter.   Garey Ham, PA-C 05/19/21 1828    Garey Ham, PA-C 05/19/21 1829

## 2021-05-22 LAB — CULTURE, GROUP A STREP (THRC)

## 2021-05-30 ENCOUNTER — Telehealth: Payer: 59 | Admitting: Nurse Practitioner

## 2021-05-30 DIAGNOSIS — J020 Streptococcal pharyngitis: Secondary | ICD-10-CM | POA: Diagnosis not present

## 2021-05-30 MED ORDER — AMOXICILLIN 500 MG PO CAPS: 500.0000 mg | ORAL_CAPSULE | Freq: Two times a day (BID) | ORAL | 0 refills | Status: AC

## 2021-05-30 MED ORDER — PREDNISONE 10 MG PO TABS: 10.0000 mg | ORAL_TABLET | Freq: Two times a day (BID) | ORAL | 0 refills | Status: AC

## 2021-05-30 NOTE — Progress Notes (Signed)
Virtual Visit Consent   Roy Huffman, you are scheduled for a virtual visit with a [facility] provider today.     Just as with appointments in the office, your consent must be obtained to participate.  Your consent will be active for this visit and any virtual visit you may have with one of our providers in the next 365 days.     If you have a MyChart account, a copy of this consent can be sent to you electronically.  All virtual visits are billed to your insurance company just like a traditional visit in the office.    As this is a virtual visit, video technology does not allow for your provider to perform a traditional examination.  This may limit your provider's ability to fully assess your condition.  If your provider identifies any concerns that need to be evaluated in person or the need to arrange testing (such as labs, EKG, etc.), we will make arrangements to do so.     Although advances in technology are sophisticated, we cannot ensure that it will always work on either your end or our end.  If the connection with a video visit is poor, the visit may have to be switched to a telephone visit.  With either a video or telephone visit, we are not always able to ensure that we have a secure connection.     I need to obtain your verbal consent now.   Are you willing to proceed with your visit today?    Roy Huffman has provided verbal consent on 05/30/2021 for a virtual visit (video or telephone).   Viviano Simas, FNP   Date: 05/30/2021 11:08 AM   Virtual Visit via Video Note   I, Viviano Simas, connected with  Roy Huffman  ([id number], [birth year]) on 05/30/21 at 11:00 AM EST by a video-enabled telemedicine application and verified that I am speaking with the correct person using two identifiers.  Location: Patient: Virtual Visit Location Patient: Home Provider: Virtual Visit Location Provider: Home Office   I discussed the limitations of evaluation and management by telemedicine and the  availability of in person appointments. The patient expressed understanding and agreed to proceed.    History of Present Illness: Roy Huffman is a 26 y.o. who identifies as a male who was assigned male at birth, and is being seen today with complaints of a fever that started yesterday.   Was diagnosed with strep throat 2 weeks ago, sore throat is persistent on the left side and he has had a return of fever in the past 24 hours.   He finished his antibiotics 05/19/21, most recently treated with azithromycin   He is able to swallow but the left side of his throat does hurt when he swallows.   He has been sick on and off for the past month, he is awaiting an appointment with an ENT due to recurring sore throats.   He denies a history of mono.   Denies sick contact.   Problems:  Patient Active Problem List   Diagnosis Date Noted   COVID-19 virus infection 09/20/2018   DOE (dyspnea on exertion) 09/19/2018   Chronic cough 09/18/2018    Allergies: No Known Allergies Medications:  Current Outpatient Medications:    azithromycin (ZITHROMAX) 500 MG tablet, Take 1 tablet (500 mg total) by mouth daily., Disp: 5 tablet, Rfl: 0   omeprazole (PRILOSEC OTC) 20 MG tablet, Take 30- 60 min before your first and last meals  of the day, Disp: , Rfl:    predniSONE (DELTASONE) 20 MG tablet, Take 1 tablet (20 mg total) by mouth daily with breakfast., Disp: 3 tablet, Rfl: 0  Observations/Objective: Patient is well-developed, well-nourished in no acute distress.  Resting comfortably at home.  Head is normocephalic, atraumatic.  No labored breathing.  Speech is clear and coherent with logical content.  Patient is alert and oriented at baseline.    Assessment and Plan: 1. Strep pharyngitis Meds ordered this encounter  Medications   predniSONE (DELTASONE) 10 MG tablet    Sig: Take 1 tablet (10 mg total) by mouth 2 (two) times daily with a meal for 5 days.    Dispense:  10 tablet    Refill:  0    amoxicillin (AMOXIL) 500 MG capsule    Sig: Take 1 capsule (500 mg total) by mouth 2 (two) times daily for 10 days.    Dispense:  20 capsule    Refill:  0   Advised follow up with ENT due to recurrent nature without known re exposure.   Patient will call to schedule today      Follow Up Instructions: I discussed the assessment and treatment plan with the patient. The patient was provided an opportunity to ask questions and all were answered. The patient agreed with the plan and demonstrated an understanding of the instructions.  A copy of instructions were sent to the patient via MyChart unless otherwise noted below.    The patient was advised to call back or seek an in-person evaluation if the symptoms worsen or if the condition fails to improve as anticipated.  Time:  I spent 15 minutes with the patient via telehealth technology discussing the above problems/concerns.    Viviano Simas, FNP

## 2021-06-23 ENCOUNTER — Ambulatory Visit (INDEPENDENT_AMBULATORY_CARE_PROVIDER_SITE_OTHER): Payer: 59 | Admitting: Podiatry

## 2021-06-23 ENCOUNTER — Other Ambulatory Visit: Payer: Self-pay

## 2021-06-23 ENCOUNTER — Encounter: Payer: Self-pay | Admitting: Podiatry

## 2021-06-23 DIAGNOSIS — M722 Plantar fascial fibromatosis: Secondary | ICD-10-CM

## 2021-06-23 DIAGNOSIS — Q666 Other congenital valgus deformities of feet: Secondary | ICD-10-CM | POA: Diagnosis not present

## 2021-06-23 NOTE — Progress Notes (Signed)
°  Subjective:  Patient ID: Roy Huffman, male    DOB: [birth year],  MRN: [id number]  Chief Complaint  Patient presents with   Plantar Fasciitis    Pt stated that he is still not doing any better as far as pain goes     26 y.o. male presents with the above complaint.  Patient presents with follow-up of Planter fasciitis patient states is still hurting and having residual pain.  He states the injection has not helped.  Bracing has not helped.  Shoe gear modification has not helped.  He still having a lot of pain.  He denies any other acute complaints.   Review of Systems: Negative except as noted in the HPI. Denies N/V/F/Ch.  Past Medical History:  Diagnosis Date   Acute medial meniscus tear     Current Outpatient Medications:    azithromycin (ZITHROMAX) 500 MG tablet, Take 1 tablet (500 mg total) by mouth daily., Disp: 5 tablet, Rfl: 0   omeprazole (PRILOSEC OTC) 20 MG tablet, Take 30- 60 min before your first and last meals of the day, Disp: , Rfl:   Social History   Tobacco Use  Smoking Status Former   Packs/day: 0.50   Years: 4.00   Pack years: 2.00   Types: Cigarettes   Quit date: 05/08/2013   Years since quitting: 8.1  Smokeless Tobacco Never    No Known Allergies Objective:  There were no vitals filed for this visit. There is no height or weight on file to calculate BMI. Constitutional Well developed. Well nourished.  Vascular Dorsalis pedis pulses palpable bilaterally. Posterior tibial pulses palpable bilaterally. Capillary refill normal to all digits.  No cyanosis or clubbing noted. Pedal hair growth normal.  Neurologic Normal speech. Oriented to person, place, and time. Epicritic sensation to light touch grossly present bilaterally.  Dermatologic Nails well groomed and normal in appearance. No open wounds. No skin lesions.  Orthopedic: Normal joint ROM without pain or crepitus bilaterally. No visible deformities. Tender to palpation at the calcaneal tuber  left. No pain with calcaneal squeeze left. Ankle ROM diminished range of motion left. Silfverskiold Test: positive left.   Radiographs: Taken and reviewed. No acute fractures or dislocations. No evidence of stress fracture.  Plantar heel spur present. Posterior heel spur absent.   Assessment:   1. Plantar fasciitis of left foot   2. Pes planovalgus       Plan:  Patient was evaluated and treated and all questions answered.  Plantar Fasciitis, left - XR reviewed as above.  - Educated on icing and stretching. Instructions given.  -I will hold off on any further injection as to injection has not helped. - DME: Cam boot immobilization - Pharmacologic management: None -We will try shoe gear modification orthotics for now.  If there is no resolve meant of asked him to come see me and we can place him in the cam boot.  Due to work he is unable to wear a boot for now.  Pes planovalgus -I explained to patient the etiology of pes planovalgus and relationship with Planter fasciitis and various treatment options were discussed.  Given patient foot structure in the setting of Planter fasciitis I believe patient will benefit from custom-made orthotics to help control the hindfoot motion support the arch of the foot and take the stress away from plantar fascial.  Patient agrees with the plan like to proceed with orthotics -Patient is awaiting for orthotics   No follow-ups on file.

## 2021-06-28 ENCOUNTER — Encounter: Payer: Self-pay | Admitting: Podiatry

## 2021-06-29 ENCOUNTER — Telehealth: Payer: Self-pay

## 2021-06-29 NOTE — Telephone Encounter (Signed)
Lvm for pt to call back to get scheduled to pick up orthotics.

## 2021-06-30 NOTE — Telephone Encounter (Signed)
Can you prepare this note for patient?

## 2021-07-01 ENCOUNTER — Encounter: Payer: Self-pay | Admitting: Podiatry

## 2021-07-21 ENCOUNTER — Other Ambulatory Visit: Payer: Self-pay

## 2021-07-21 ENCOUNTER — Encounter: Payer: Self-pay | Admitting: Podiatry

## 2021-07-21 ENCOUNTER — Ambulatory Visit: Payer: 59 | Admitting: Podiatry

## 2021-07-21 DIAGNOSIS — M722 Plantar fascial fibromatosis: Secondary | ICD-10-CM

## 2021-07-21 DIAGNOSIS — Z01818 Encounter for other preprocedural examination: Secondary | ICD-10-CM | POA: Diagnosis not present

## 2021-07-21 DIAGNOSIS — M21862 Other specified acquired deformities of left lower leg: Secondary | ICD-10-CM | POA: Diagnosis not present

## 2021-07-21 DIAGNOSIS — M62462 Contracture of muscle, left lower leg: Secondary | ICD-10-CM

## 2021-07-25 ENCOUNTER — Telehealth: Payer: Self-pay | Admitting: Urology

## 2021-07-25 NOTE — Telephone Encounter (Signed)
DOS - 08/15/21 ? ?EPF LEFT --- [id number] ?GASTROCNEMIUS RECESS LEFT --- [id number] ? ? ?[employer] EFFECTIVE DATE - 05/08/21 ? ?PLAN DEDUCTIBLE - $700.00 W/ $427.71 REMAINING ?OUT OF POCKET - $3,500.00 W/ $3,070.73 REMAINING ?COINSURANCE - 20% ?COPAY - $0.00 ? ? ?PER [employer] [place] [id number] AND [id number] HAVE BEEN APPROVED, AUTH # [id number], GOOD FROM 08/15/21 - 11/13/21. ?

## 2021-07-26 ENCOUNTER — Encounter: Payer: Self-pay | Admitting: Unknown Physician Specialty

## 2021-07-26 NOTE — Progress Notes (Signed)
?Subjective:  ?Patient ID: Roy Huffman, male    DOB: 20-Jan-1996,  MRN: [id number] ? ?Chief Complaint  ?Patient presents with  ? Plantar Fasciitis  ?  "Its not any better, sometimes it feels worse than before"  ? ? ?26 y.o. male presents with the above complaint.  Patient presents with follow-up of Planter fasciitis patient states is still hurting and having residual pain.  The injection immobilization nothing has helped.  Patient would like to discuss surgical options at this time. ? ? ?Review of Systems: Negative except as noted in the HPI. Denies N/V/F/Ch. ? ?Past Medical History:  ?Diagnosis Date  ? Acute medial meniscus tear   ? ? ?Current Outpatient Medications:  ?  azithromycin (ZITHROMAX) 500 MG tablet, Take 1 tablet (500 mg total) by mouth daily., Disp: 5 tablet, Rfl: 0 ?  omeprazole (PRILOSEC OTC) 20 MG tablet, Take 30- 60 min before your first and last meals of the day, Disp: , Rfl:  ? ?Social History  ? ?Tobacco Use  ?Smoking Status Former  ? Packs/day: 0.50  ? Years: 4.00  ? Pack years: 2.00  ? Types: Cigarettes  ? Quit date: 05/08/2013  ? Years since quitting: 8.2  ?Smokeless Tobacco Never  ? ? ?No Known Allergies ?Objective:  ?There were no vitals filed for this visit. ?There is no height or weight on file to calculate BMI. ?Constitutional Well developed. ?Well nourished.  ?Vascular Dorsalis pedis pulses palpable bilaterally. ?Posterior tibial pulses palpable bilaterally. ?Capillary refill normal to all digits.  ?No cyanosis or clubbing noted. ?Pedal hair growth normal.  ?Neurologic Normal speech. ?Oriented to person, place, and time. ?Epicritic sensation to light touch grossly present bilaterally.  ?Dermatologic Nails well groomed and normal in appearance. ?No open wounds. ?No skin lesions.  ?Orthopedic: Normal joint ROM without pain or crepitus bilaterally. ?No visible deformities. ?Tender to palpation at the calcaneal tuber left. ?No pain with calcaneal squeeze left. ?Ankle ROM diminished range of  motion left. ?Silfverskiold Test: positive left.  ? ?Radiographs: Taken and reviewed. No acute fractures or dislocations. No evidence of stress fracture.  Plantar heel spur present. Posterior heel spur absent.  ? ?Assessment:  ? ?1. Plantar fasciitis of left foot   ?2. Gastrocnemius equinus, left   ?3. Encounter for preoperative examination for general surgical procedure   ? ? ? ? ? ?Plan:  ?Patient was evaluated and treated and all questions answered. ? ?Plantar Fasciitis, left ?-Clinically nothing is helped with the Planter fasciitis.  At this time I discussed with the patient that since he has failed all conservative treatment options including shoe gear modification injection and immobilization I believe patient would benefit from surgical release of the plantar fascia.  I discussed my preoperative intraoperative postoperative plan in extensive detail.  I will plan on doing endoscopic plantar fasciotomy with gastrocnemius recession.  I discussed most of these procedures with the patient in extensive detail he states understand like to proceed with the surgery. ?-Informed surgical risk consent was reviewed and read aloud to the patient.  I reviewed the films.  I have discussed my findings with the patient in great detail.  I have discussed all risks including but not limited to infection, stiffness, scarring, limp, disability, deformity, damage to blood vessels and nerves, numbness, poor healing, need for braces, arthritis, chronic pain, amputation, death.  All benefits and realistic expectations discussed in great detail.  I have made no promises as to the outcome.  I have provided realistic expectations.  I have offered  the patient a 2nd opinion, which they have declined and assured me they preferred to proceed despite the risks ? ? ?Pes planovalgus ?-I explained to patient the etiology of pes planovalgus and relationship with Planter fasciitis and various treatment options were discussed.  Given patient foot  structure in the setting of Planter fasciitis I believe patient will benefit from custom-made orthotics to help control the hindfoot motion support the arch of the foot and take the stress away from plantar fascial.  Patient agrees with the plan like to proceed with orthotics ?-Patient is awaiting for orthotics ? ? ?No follow-ups on file. ? ?

## 2021-07-28 NOTE — Discharge Instructions (Signed)
[place] [facility] ? ?Margaretha Sheffield, MD ? ?[address] [place], [place] [address] TEL.  ?[phone] ? ?INFORMATION SHEET FOR A TONSILLECTOMY AND ADENDOIDECTOMY ? ?About Your Tonsils and Adenoids ? The tonsils and adenoids are normal body tissues that are part of our immune system.  They normally help to protect [place] against diseases that may enter our mouth and nose. However, sometimes the tonsils and/or adenoids become too large and obstruct our breathing, especially at night. ?  ? If either of these things happen it helps to remove the tonsils and adenoids in order to become healthier. The operation to remove the tonsils and adenoids is called a tonsillectomy and adenoidectomy. ? ?The Location of Your Tonsils and Adenoids ? The tonsils are located in the back of the throat on both side and sit in a cradle of muscles. The adenoids are located in the roof of the mouth, behind the nose, and closely associated with the opening of the Eustachian tube to the ear. ? ?Surgery on Tonsils and Adenoids ? A tonsillectomy and adenoidectomy is a short operation which takes about thirty minutes.  This includes being put to sleep and being awakened. Tonsillectomies and adenoidectomies are performed at [facility] and may require observation period in the recovery room prior to going home. Children are required to remain in recovery for at least 45 minutes.  ? ?Following the Operation for a Tonsillectomy ? A cautery machine is used to control bleeding. Bleeding from a tonsillectomy and adenoidectomy is minimal and postoperatively the risk of bleeding is approximately four percent, although this rarely life threatening. ? ?After your tonsillectomy and adenoidectomy post-op care at home: ?1. Our patients are able to go home the same day. You may be given prescriptions for pain medications, if indicated. ?2. It is extremely important to  remember that fluid intake is of utmost importance after a tonsillectomy. The amount that you drink must be maintained in the postoperative period. A good indication of whether a child is getting enough fluid is whether his/her urine output is constant. As long as children are urinating or wetting their diaper every 6 - 8 hours this is usually enough fluid intake.   ?3. Although rare, this is a risk of some bleeding in the first ten days after surgery. This usually occurs between day five and nine postoperatively. This risk of bleeding is approximately four percent. If you or your child should have any bleeding you should remain calm and notify our office or go directly to the emergency room at [facility] where they will contact us. Our doctors are available seven days a week for notification. We recommend sitting up quietly in a chair, place an ice pack on the front of the neck and spitting out the blood gently until we are able to contact you. Adults should gargle gently with ice water and this may help stop the bleeding. If the bleeding does not stop after a short time, i.e. 10 to 15 minutes, or seems to be increasing again, please contact us or go to the hospital.   ?4. It is common for the pain to be worse at 5 - 7 days postoperatively. This occurs because the ?scab? is peeling off and the mucous membrane (skin of the throat) is growing back where the tonsils were.   ?5. It is common for a low-grade fever, less than 102, during the first week  after a tonsillectomy and adenoidectomy. It is usually due to not drinking enough liquids, and we suggest your use liquid Tylenol (acetaminophen) or the pain medicine with Tylenol (acetaminophen) prescribed in order to keep your temperature below 102. Please follow the directions on the back of the bottle. ?6. Recommendations for post-operative pain in children and adults: ?a) For Children 12 and younger: Recommendations are for oral Tylenol  (acetaminophen) and oral Motrin (ibuprofen). Administer the Tylenol (acetaminophen) and Motrin as stated on bottle for patient's age/weight. Sometimes it may be necessary to alternate the Tylenol (acetaminophen) and Motrin for improved pain control. Motrin (ibuprofen) does last slightly longer so many patients benefit from being given this prior to bedtime. All children should avoid Aspirin products for 2 weeks following surgery. ?b) For children over the age of 45: Tylenol (acetaminophen) is the preferred first choice for pain control. Depending on your child's size, sometimes they will be given a combination of Tylenol (acetaminophen) and hydrocodone medication or sometimes it will be recommended they take Motrin (ibuprofen) in addition to the Tylenol (acetaminophen). Narcotics should always be used with caution in children following surgery as they can suppress their breathing and switching to over the counter Tylenol (acetaminophen) and Motrin (ibuprofen) as soon as possible is recommended. All patients should avoid Aspirin products for 2 weeks following surgery. ?c) Adults: Usually adults will require a narcotic pain medication following a tonsillectomy. This usually has either hydrocodone or oxycodone in it and can usually be taken every 4 to 6 hours as needed for moderate pain. If the medication does not have Tylenol (acetaminophen) in it, you may also supplement Tylenol (acetaminophen) as needed every 4 to 6 hours for breakthrough or mild pain. Adults should avoid Aspirin, Aleve, Motrin, and Ibuprofen products for 2 weeks following surgery as they can increase your risk of bleeding. ?7. If you happen to look in the mirror or into your child's mouth you will see white/gray patches on the back of the throat. This is what a scab looks like in the mouth and is normal after having a tonsillectomy and adenoidectomy. They will disappear once the tonsil areas heal completely. However, it may cause a noticeable odor,  and this too will disappear with time.     ?8. You or your child may experience ear pain after having a tonsillectomy and adenoidectomy.  This is called referred pain and comes from the throat, but it is felt in the ears.  Ear pain is quite common and expected. It will usually go away after ten days. There is usually nothing wrong with the ears, and it is primarily due to the healing area stimulating the nerve to the ear that runs along the side of the throat. Use either the prescribed pain medicine or Tylenol (acetaminophen) as needed.  ?9. The throat tissues after a tonsillectomy are obviously sensitive. Smoking around children who have had a tonsillectomy significantly increases the risk of bleeding. DO NOT SMOKE! ? ?What to Expect Each Day  ?First Day at Home ?1. Patients will be discharged home the same day.  ?2. Drink at least four glasses of liquid a day. Clear, cool liquids are recommended. Fruit juices containing citric acid are not recommended because they tend to cause pain. Carbonated beverages are allowed if you pour them from glass to glass to remove the bubbles as these tend to cause discomfort. Avoid alcoholic beverages.  ?3. Eat very soft foods such as soups, broth, jello, custard, pudding, ice cream, popsicles, applesauce, mashed potatoes,  and in general anything that you can crush between your tongue and the roof of your mouth. Try adding [employer] Mix into your food for extra calories. It is not uncommon to lose 5 to 10 pounds of fluid weight. The weight will be gained back quickly once you're feeling better and drinking more.  ?4. Sleep with your head elevated on two pillows for about three days to help decrease the swelling.  ?5. DO NOT SMOKE!  ?Day Two  ?1. Rest as much as possible. Use common sense in your activities.  ?2. Continue drinking at least four glasses of liquid per day.  ?3. Follow the soft diet.  ?4. Use your pain medication as needed.  ?Day Three  ?1. Advance  your activity as you are able and continue to follow the previous day's suggestions.  ?Days Four Through Six  ?1. Advance your diet and begin to eat more solid foods such as chopped hamburger. ?2. Advance your Marya Fossa

## 2021-08-05 ENCOUNTER — Ambulatory Visit
Admission: RE | Admit: 2021-08-05 | Discharge: 2021-08-05 | Disposition: A | Discharge status: Home / Self Care | Payer: 59 | Source: Ambulatory Visit | Attending: Unknown Physician Specialty | Admitting: Unknown Physician Specialty

## 2021-08-05 ENCOUNTER — Ambulatory Visit: Payer: 59 | Admitting: Anesthesiology

## 2021-08-05 ENCOUNTER — Encounter: Payer: Self-pay | Admitting: Unknown Physician Specialty

## 2021-08-05 ENCOUNTER — Other Ambulatory Visit: Payer: Self-pay

## 2021-08-05 ENCOUNTER — Encounter
Admission: RE | Disposition: A | Discharge status: Home / Self Care | Payer: Self-pay | Source: Ambulatory Visit | Attending: Unknown Physician Specialty

## 2021-08-05 DIAGNOSIS — E669 Obesity, unspecified: Secondary | ICD-10-CM | POA: Insufficient documentation

## 2021-08-05 DIAGNOSIS — Z87891 Personal history of nicotine dependence: Secondary | ICD-10-CM | POA: Diagnosis not present

## 2021-08-05 DIAGNOSIS — J3501 Chronic tonsillitis: Secondary | ICD-10-CM | POA: Diagnosis not present

## 2021-08-05 DIAGNOSIS — J3503 Chronic tonsillitis and adenoiditis: Secondary | ICD-10-CM | POA: Diagnosis present

## 2021-08-05 DIAGNOSIS — Z6832 Body mass index (BMI) 32.0-32.9, adult: Secondary | ICD-10-CM | POA: Insufficient documentation

## 2021-08-05 HISTORY — PX: TONSILLECTOMY: SHX5217

## 2021-08-05 HISTORY — DX: Attention-deficit hyperactivity disorder, unspecified type: F90.9

## 2021-08-05 SURGERY — TONSILLECTOMY
Anesthesia: General | Site: Mouth

## 2021-08-05 MED ORDER — ONDANSETRON HCL 4 MG/2ML IJ SOLN
INTRAMUSCULAR | Status: DC | PRN
  Administered 2021-08-05: 4 mg via INTRAVENOUS

## 2021-08-05 MED ORDER — ACETAMINOPHEN 10 MG/ML IV SOLN
1000.0000 mg | Freq: Once | INTRAVENOUS | Status: AC
Start: 2021-08-05 — End: 2021-08-05
  Administered 2021-08-05: 1000 mg via INTRAVENOUS

## 2021-08-05 MED ORDER — GLYCOPYRROLATE 0.2 MG/ML IJ SOLN
INTRAMUSCULAR | Status: DC | PRN
Start: 2021-08-05 — End: 2021-08-05
  Administered 2021-08-05: .1 mg via INTRAVENOUS

## 2021-08-05 MED ORDER — PROPOFOL 10 MG/ML IV BOLUS
INTRAVENOUS | Status: DC | PRN
  Administered 2021-08-05: 200 mg via INTRAVENOUS

## 2021-08-05 MED ORDER — SUCCINYLCHOLINE CHLORIDE 200 MG/10ML IV SOSY
PREFILLED_SYRINGE | INTRAVENOUS | Status: DC | PRN
  Administered 2021-08-05: 100 mg via INTRAVENOUS

## 2021-08-05 MED ORDER — FENTANYL CITRATE PF 50 MCG/ML IJ SOSY
25.0000 ug | PREFILLED_SYRINGE | INTRAMUSCULAR | Status: DC | PRN
  Administered 2021-08-05 (×2): 50 ug via INTRAVENOUS

## 2021-08-05 MED ORDER — LIDOCAINE VISCOUS HCL 2 % MT SOLN: 15.0000 mL | OROMUCOSAL | 4 refills | Status: DC | PRN

## 2021-08-05 MED ORDER — BUPIVACAINE HCL (PF) 0.5 % IJ SOLN
INTRAMUSCULAR | Status: DC | PRN
  Administered 2021-08-05: 8 mL

## 2021-08-05 MED ORDER — LACTATED RINGERS IV SOLN: INTRAVENOUS | Status: DC

## 2021-08-05 MED ORDER — DEXAMETHASONE SODIUM PHOSPHATE 4 MG/ML IJ SOLN
INTRAMUSCULAR | Status: DC | PRN
  Administered 2021-08-05: 10 mg via INTRAVENOUS

## 2021-08-05 MED ORDER — FENTANYL CITRATE (PF) 100 MCG/2ML IJ SOLN
INTRAMUSCULAR | Status: DC | PRN
  Administered 2021-08-05: 25 ug via INTRAVENOUS
  Administered 2021-08-05: 100 ug via INTRAVENOUS

## 2021-08-05 MED ORDER — MIDAZOLAM HCL 5 MG/5ML IJ SOLN
INTRAMUSCULAR | Status: DC | PRN
Start: 2021-08-05 — End: 2021-08-05
  Administered 2021-08-05: 2 mg via INTRAVENOUS

## 2021-08-05 MED ORDER — OXYCODONE HCL 5 MG PO TABS: 5.0000 mg | ORAL_TABLET | Freq: Once | ORAL | Status: AC | PRN

## 2021-08-05 MED ORDER — DEXMEDETOMIDINE (PRECEDEX) IN NS 20 MCG/5ML (4 MCG/ML) IV SYRINGE
PREFILLED_SYRINGE | INTRAVENOUS | Status: DC | PRN
  Administered 2021-08-05 (×2): 10 ug via INTRAVENOUS

## 2021-08-05 MED ORDER — LIDOCAINE HCL (CARDIAC) PF 100 MG/5ML IV SOSY
PREFILLED_SYRINGE | INTRAVENOUS | Status: DC | PRN
Start: 2021-08-05 — End: 2021-08-05
  Administered 2021-08-05: 50 mg via INTRAVENOUS

## 2021-08-05 MED ORDER — OXYCODONE HCL 5 MG/5ML PO SOLN
5.0000 mg | Freq: Once | ORAL | Status: AC | PRN
  Administered 2021-08-05: 5 mg via ORAL

## 2021-08-05 MED ORDER — HYDROCODONE-ACETAMINOPHEN 7.5-325 MG/15ML PO SOLN: 15.0000 mL | Freq: Four times a day (QID) | ORAL | 0 refills | Status: AC | PRN

## 2021-08-05 SURGICAL SUPPLY — 19 items
"PENCIL ELECTRO HAND CTR " (MISCELLANEOUS) ×1 IMPLANT
CANISTER SUCT 1200ML W/VALVE (MISCELLANEOUS) ×3 IMPLANT
CATH RUBBER RED 8F (CATHETERS) ×3 IMPLANT
COAG SUCT 10F 3.5MM HAND CTRL (MISCELLANEOUS) ×3 IMPLANT
DRAPE HEAD BAR (DRAPES) ×3 IMPLANT
ELECT CAUTERY BLADE TIP 2.5 (TIP) ×3
ELECT REM PT RETURN 9FT ADLT (ELECTROSURGICAL) ×3
ELECTRODE CAUTERY BLDE TIP 2.5 (TIP) ×2 IMPLANT
ELECTRODE REM PT RTRN 9FT ADLT (ELECTROSURGICAL) ×2 IMPLANT
GLOVE SURG ENC TEXT LTX SZ7.5 (GLOVE) ×3 IMPLANT
KIT TURNOVER KIT A (KITS) ×3 IMPLANT
NS IRRIG 500ML POUR BTL (IV SOLUTION) ×3 IMPLANT
PACK TONSIL AND ADENOID CUSTOM (PACKS) ×3 IMPLANT
PENCIL ELECTRO HAND CTR (MISCELLANEOUS) ×3 IMPLANT
SOL ANTI-FOG 6CC FOG-OUT (MISCELLANEOUS) ×2 IMPLANT
SOL FOG-OUT ANTI-FOG 6CC (MISCELLANEOUS) ×1
SPONGE TONSIL 1 RF SGL (DISPOSABLE) ×3 IMPLANT
STRAP BODY AND KNEE 60X3 (MISCELLANEOUS) ×3 IMPLANT
SYR 10ML LL (SYRINGE) ×3 IMPLANT

## 2021-08-05 NOTE — H&P (Signed)
The patient's history has been reviewed, patient examined, no change in status, stable for surgery.  Questions were answered to the patients satisfaction.  

## 2021-08-05 NOTE — Anesthesia Procedure Notes (Signed)
Procedure Name: Intubation ?Date/Time: 08/05/2021 9:17 AM ?Performed by: Jimmy Picket, CRNA ?Pre-anesthesia Checklist: Patient identified, Emergency Drugs available, Suction available, Patient being monitored and Timeout performed ?Patient Re-evaluated:Patient Re-evaluated prior to induction ?Oxygen Delivery Method: Circle system utilized ?Preoxygenation: Pre-oxygenation with 100% oxygen ?Induction Type: IV induction ?Ventilation: Mask ventilation without difficulty ?Laryngoscope Size: Hyacinth Meeker and 3 ?Grade View: Grade I ?Tube type: Oral Sheilah Pigeon ?Tube size: 8.0 mm ?Number of attempts: 1 ?Placement Confirmation: ETT inserted through vocal cords under direct vision, positive ETCO2 and breath sounds checked- equal and bilateral ?Tube secured with: Tape ?Dental Injury: Teeth and Oropharynx as per pre-operative assessment  ? ? ? ? ?

## 2021-08-05 NOTE — Transfer of Care (Signed)
Immediate Anesthesia Transfer of Care Note ? ?Patient: Roy Huffman ? ?Procedure(s) Performed: TONSILLECTOMY (Mouth) ? ?Patient Location: PACU ? ?Anesthesia Type: General ? ?Level of Consciousness: awake, alert  and patient cooperative ? ?Airway and Oxygen Therapy: Patient Spontanous Breathing and Patient connected to supplemental oxygen ? ?Post-op Assessment: Post-op Vital signs reviewed, Patient's Cardiovascular Status Stable, Respiratory Function Stable, Patent Airway and No signs of Nausea or vomiting ? ?Post-op Vital Signs: Reviewed and stable ? ?Complications: No notable events documented. ? ?

## 2021-08-05 NOTE — Anesthesia Preprocedure Evaluation (Signed)
Anesthesia Evaluation  ?Patient identified by MRN, date of birth, ID band ?Patient awake ? ? ? ?Reviewed: ?Allergy & Precautions, NPO status  ? ?Airway ?Mallampati: II ? ?TM Distance: >3 FB ? ? ? ? Dental ?  ?Pulmonary ?Patient abstained from smoking., former smoker,  ?  ?Pulmonary exam normal ? ? ? ? ? ? ? Cardiovascular ?negative cardio ROS ? ? ?Rhythm:Regular Rate:Normal ? ? ?  ?Neuro/Psych ?PSYCHIATRIC DISORDERS (ADHD)   ? GI/Hepatic ?negative GI ROS,   ?Endo/Other  ?Obesity - BMI >30 ? Renal/GU ?  ? ?  ?Musculoskeletal ? ? Abdominal ?  ?Peds ? Hematology ?  ?Anesthesia Other Findings ? ? Reproductive/Obstetrics ? ?  ? ? ? ? ? ? ? ? ? ? ? ? ? ?  ?  ? ? ? ? ? ? ? ? ?Anesthesia Physical ?Anesthesia Plan ? ?ASA: 2 ? ?Anesthesia Plan: General  ? ?Post-op Pain Management:   ? ?Induction: Intravenous ? ?PONV Risk Score and Plan: Ondansetron, Dexamethasone, Midazolam and Treatment may vary due to age or medical condition ? ?Airway Management Planned: Oral ETT ? ?Additional Equipment:  ? ?Intra-op Plan:  ? ?Post-operative Plan:  ? ?Informed Consent: I have reviewed the patients History and Physical, chart, labs and discussed the procedure including the risks, benefits and alternatives for the proposed anesthesia with the patient or authorized representative who has indicated his/her understanding and acceptance.  ? ? ? ?Dental advisory given ? ?Plan Discussed with: CRNA ? ?Anesthesia Plan Comments:   ? ? ? ? ? ? ?Anesthesia Quick Evaluation ? ?

## 2021-08-05 NOTE — Op Note (Signed)
PREOPERATIVE DIAGNOSIS:  Chronic tonsillitis and adenoiditis ? ?POSTOPERATIVE DIAGNOSIS:  Chronic Tonsillitis ? ?OPERATION:  Tonsillectomy. ? ?SURGEON:  Roena Malady, MD ? ?ANESTHESIA:  General endotracheal. ? ?OPERATIVE FINDINGS:  Large tonsils.  No significant adenoid tissue noted in nasopharynx ? ?DESCRIPTION OF THE PROCEDURE: Roy Huffman was identified in the holding area and taken to the operating room and placed in the supine position.  After general endotracheal anesthesia, the table was turned 45 degrees and the patient was draped in the usual fashion for a tonsillectomy.  A mouth gag was inserted into the oral cavity.  There were large tonsils.  A red rubber catheter was placed through the nostril and suspended through the mouth.  Inspection of the nasopharynx with a mirror showed no significant adenoid tissue therefore adenoidectomy was not performed.  Operation then proceeded with tonsillectomy.  Beginning on the left-hand side a tenaculum was used to grasp the tonsil and the Bovie cautery was used to dissect it free from the fossa.  In a similar fashion, the right tonsil was removed.  Meticulous hemostasis was achieved using the Bovie cautery.  With both tonsils removed and no active bleeding, 0.5% plain Marcaine was used to inject the anterior and posterior tonsillar pillars bilaterally.  A total of 56ml was used.  The patient tolerated the procedure well and was awakened in the operating room and taken to the recovery room in stable condition.  ? ?CULTURES:  None. ? ?SPECIMENS:  Tonsils. ? ?ESTIMATED BLOOD LOSS:  Less than 10 ml. ? ?Roena Malady ? ?08/05/2021 ? ?9:40 AM  ?

## 2021-08-05 NOTE — Anesthesia Postprocedure Evaluation (Signed)
Anesthesia Post Note ? ?Patient: Roy Huffman ? ?Procedure(s) Performed: TONSILLECTOMY (Mouth) ? ? ?  ?Patient location during evaluation: PACU ?Anesthesia Type: General ?Level of consciousness: awake ?Pain management: pain level controlled ?Vital Signs Assessment: post-procedure vital signs reviewed and stable ?Respiratory status: respiratory function stable ?Cardiovascular status: stable ?Postop Assessment: no signs of nausea or vomiting ?Anesthetic complications: no ? ? ?No notable events documented. ? ?Jola Babinski ? ? ? ? ? ?

## 2021-08-08 LAB — SURGICAL PATHOLOGY

## 2021-08-11 ENCOUNTER — Ambulatory Visit: Payer: 59 | Admitting: Adult Health

## 2021-08-12 ENCOUNTER — Encounter: Payer: Self-pay | Admitting: Podiatry

## 2021-08-15 ENCOUNTER — Other Ambulatory Visit: Payer: Self-pay | Admitting: Podiatry

## 2021-08-15 ENCOUNTER — Encounter: Payer: Self-pay | Admitting: Podiatry

## 2021-08-15 DIAGNOSIS — M216X2 Other acquired deformities of left foot: Secondary | ICD-10-CM | POA: Diagnosis not present

## 2021-08-15 DIAGNOSIS — M722 Plantar fascial fibromatosis: Secondary | ICD-10-CM | POA: Diagnosis not present

## 2021-08-15 MED ORDER — OXYCODONE-ACETAMINOPHEN 5-325 MG PO TABS: 1.0000 | ORAL_TABLET | ORAL | 0 refills | Status: DC | PRN

## 2021-08-15 MED ORDER — IBUPROFEN 800 MG PO TABS
800.0000 mg | ORAL_TABLET | Freq: Four times a day (QID) | ORAL | 1 refills | Status: DC | PRN
Start: 2021-08-15 — End: 2023-03-16

## 2021-08-18 ENCOUNTER — Encounter: Payer: Self-pay | Admitting: Podiatry

## 2021-08-23 ENCOUNTER — Ambulatory Visit (INDEPENDENT_AMBULATORY_CARE_PROVIDER_SITE_OTHER): Payer: 59 | Admitting: Podiatry

## 2021-08-23 ENCOUNTER — Encounter: Payer: Self-pay | Admitting: Podiatry

## 2021-08-23 DIAGNOSIS — M722 Plantar fascial fibromatosis: Secondary | ICD-10-CM

## 2021-08-23 DIAGNOSIS — Z9889 Other specified postprocedural states: Secondary | ICD-10-CM

## 2021-08-23 DIAGNOSIS — M21862 Other specified acquired deformities of left lower leg: Secondary | ICD-10-CM

## 2021-08-23 NOTE — Progress Notes (Signed)
?  Subjective:  ?Patient ID: Roy Huffman, male    DOB: 1995/09/18,  MRN: [id number] ? ?Chief Complaint  ?Patient presents with  ? Routine Post Op  ?   POV #1 DOS 08/15/2021 LT EPF W/GASTROCNEMIUS RECESS  ? ? ?DOS: 08/15/2021 ?Procedure: Left EPF with gastrocnemius recession ? ?26 y.o. male returns for post-op check.  Patient states that he is doing well.  Minimal pain.  Is ambulating weightbearing as tolerated the boot.  He has not seen anyone else prior to seeing me.  He denies any other acute complaints. ? ?Review of Systems: Negative except as noted in the HPI. Denies N/V/F/Ch. ? ?Past Medical History:  ?Diagnosis Date  ? Acute medial meniscus tear   ? ADHD   ? ? ?Current Outpatient Medications:  ?  azithromycin (ZITHROMAX) 500 MG tablet, Take 1 tablet (500 mg total) by mouth daily. (Patient not taking: Reported on 07/26/2021), Disp: 5 tablet, Rfl: 0 ?  HYDROcodone-acetaminophen (HYCET) 7.5-325 mg/15 ml solution, Take 15 mLs by mouth 4 (four) times daily as needed for moderate pain., Disp: 450 mL, Rfl: 0 ?  ibuprofen (ADVIL) 800 MG tablet, Take 1 tablet (800 mg total) by mouth every 6 (six) hours as needed., Disp: 60 tablet, Rfl: 1 ?  lidocaine (XYLOCAINE) 2 % solution, Use as directed 15 mLs in the mouth or throat as needed for mouth pain (gargle and spit prn mouth pain)., Disp: 150 mL, Rfl: 4 ?  omeprazole (PRILOSEC OTC) 20 MG tablet, Take 30- 60 min before your first and last meals of the day (Patient not taking: Reported on 07/26/2021), Disp: , Rfl:  ?  oxyCODONE-acetaminophen (PERCOCET) 5-325 MG tablet, Take 1 tablet by mouth every 4 (four) hours as needed for severe pain., Disp: 30 tablet, Rfl: 0 ? ?Social History  ? ?Tobacco Use  ?Smoking Status Former  ? Years: 10.00  ? Types: Cigarettes, E-cigarettes  ?Smokeless Tobacco Never  ? ? ?No Known Allergies ?Objective:  ?There were no vitals filed for this visit. ?There is no height or weight on file to calculate BMI. ?Constitutional Well developed. ?Well nourished.   ?Vascular Foot warm and well perfused. ?Capillary refill normal to all digits.   ?Neurologic Normal speech. ?Oriented to person, place, and time. ?Epicritic sensation to light touch grossly present bilaterally.  ?Dermatologic Skin healing well without signs of infection. Skin edges well coapted without signs of infection.  ?Orthopedic: Tenderness to palpation noted about the surgical site.  ? ?Radiographs: None ?Assessment:  ? ?1. Plantar fasciitis of left foot   ?2. Gastrocnemius equinus, left   ?3. Status post foot surgery   ? ?Plan:  ?Patient was evaluated and treated and all questions answered. ? ?S/p foot surgery left ?-Progressing as expected post-operatively. ?-XR: See above ?-WB Status: Weightbearing as tolerated in cam boot ?-Sutures: Intact.  No clinical signs of Deis is noted.  No complication noted. ?-Medications: None ?-Foot redressed. ? ?No follow-ups on file.  ?

## 2021-09-06 ENCOUNTER — Ambulatory Visit (INDEPENDENT_AMBULATORY_CARE_PROVIDER_SITE_OTHER): Payer: 59 | Admitting: Podiatry

## 2021-09-06 DIAGNOSIS — M722 Plantar fascial fibromatosis: Secondary | ICD-10-CM

## 2021-09-06 DIAGNOSIS — M21862 Other specified acquired deformities of left lower leg: Secondary | ICD-10-CM

## 2021-09-06 DIAGNOSIS — Z9889 Other specified postprocedural states: Secondary | ICD-10-CM

## 2021-09-06 NOTE — Progress Notes (Signed)
?  Subjective:  ?Patient ID: Roy Huffman, male    DOB: [birth year],  MRN: [id number] ? ?Chief Complaint  ?Patient presents with  ? Routine Post Op  ?  POV #2 DOS 08/15/2021 LT EPF W/GASTROCNEMIUS RECESS  ? ? ?DOS: 08/15/2021 ?Procedure: Left EPF with gastrocnemius recession ? ?26 y.o. male returns for post-op check.  Patient states that he is doing well.  Minimal pain.  Is ambulating weightbearing as tolerated the boot.  He has not seen anyone else prior to seeing me.  He denies any other acute complaints. ? ?Review of Systems: Negative except as noted in the HPI. Denies N/V/F/Ch. ? ?Past Medical History:  ?Diagnosis Date  ? Acute medial meniscus tear   ? ADHD   ? ? ?Current Outpatient Medications:  ?  azithromycin (ZITHROMAX) 500 MG tablet, Take 1 tablet (500 mg total) by mouth daily. (Patient not taking: Reported on 07/26/2021), Disp: 5 tablet, Rfl: 0 ?  HYDROcodone-acetaminophen (HYCET) 7.5-325 mg/15 ml solution, Take 15 mLs by mouth 4 (four) times daily as needed for moderate pain., Disp: 450 mL, Rfl: 0 ?  ibuprofen (ADVIL) 800 MG tablet, Take 1 tablet (800 mg total) by mouth every 6 (six) hours as needed., Disp: 60 tablet, Rfl: 1 ?  lidocaine (XYLOCAINE) 2 % solution, Use as directed 15 mLs in the mouth or throat as needed for mouth pain (gargle and spit prn mouth pain)., Disp: 150 mL, Rfl: 4 ?  omeprazole (PRILOSEC OTC) 20 MG tablet, Take 30- 60 min before your first and last meals of the day (Patient not taking: Reported on 07/26/2021), Disp: , Rfl:  ?  oxyCODONE-acetaminophen (PERCOCET) 5-325 MG tablet, Take 1 tablet by mouth every 4 (four) hours as needed for severe pain., Disp: 30 tablet, Rfl: 0 ? ?Social History  ? ?Tobacco Use  ?Smoking Status Former  ? Years: 10.00  ? Types: Cigarettes, E-cigarettes  ?Smokeless Tobacco Never  ? ? ?No Known Allergies ?Objective:  ?There were no vitals filed for this visit. ?There is no height or weight on file to calculate BMI. ?Constitutional Well developed. ?Well nourished.   ?Vascular Foot warm and well perfused. ?Capillary refill normal to all digits.   ?Neurologic Normal speech. ?Oriented to person, place, and time. ?Epicritic sensation to light touch grossly present bilaterally.  ?Dermatologic Skin completely reepithelialized.  Good correction alignment noted.  Good range of motion noted at the ankle joint 10 degrees past dorsiflexion  ?Orthopedic: Tenderness to palpation noted about the surgical site.  ? ?Radiographs: None ?Assessment:  ? ?1. Plantar fasciitis of left foot   ?2. Gastrocnemius equinus, left   ?3. Status post foot surgery   ? ? ?Plan:  ?Patient was evaluated and treated and all questions answered. ? ?S/p foot surgery left ?-Progressing as expected post-operatively. ?-XR: See above ?-WB Status: Weightbearing as tolerated in regular shoes ?-Sutures: Removed no clinical signs of Deis is noted.  No complication noted. ?-Medications: None ?-Foot redressed. ?-Patient can return to work on Monday if he is able to otherwise he will need to stay out of work until he is able to transition back into it. ? ?No follow-ups on file.  ?

## 2021-09-07 ENCOUNTER — Encounter: Payer: Self-pay | Admitting: Podiatry

## 2021-09-14 ENCOUNTER — Telehealth: Payer: Self-pay | Admitting: Podiatry

## 2021-09-14 NOTE — Telephone Encounter (Signed)
Mr. Grow job is not willing to let him return to work with restrictions. Please advise? ?

## 2021-09-16 ENCOUNTER — Encounter: Payer: Self-pay | Admitting: Podiatry

## 2021-09-22 ENCOUNTER — Encounter: Payer: Self-pay | Admitting: Podiatry

## 2021-09-26 ENCOUNTER — Encounter: Payer: Self-pay | Admitting: Podiatry

## 2021-10-22 ENCOUNTER — Other Ambulatory Visit: Payer: Self-pay

## 2021-10-22 ENCOUNTER — Ambulatory Visit
Admission: RE | Admit: 2021-10-22 | Discharge: 2021-10-22 | Disposition: A | Discharge status: Home / Self Care | Payer: 59 | Source: Ambulatory Visit | Attending: Emergency Medicine | Admitting: Emergency Medicine

## 2021-10-22 VITALS — BP 129/75 | HR 92 | Temp 98.4°F | Resp 18

## 2021-10-22 DIAGNOSIS — J209 Acute bronchitis, unspecified: Secondary | ICD-10-CM | POA: Diagnosis not present

## 2021-10-22 MED ORDER — AZITHROMYCIN 250 MG PO TABS: 250.0000 mg | ORAL_TABLET | Freq: Every day | ORAL | 0 refills | Status: DC

## 2021-10-22 MED ORDER — BENZONATATE 100 MG PO CAPS: 100.0000 mg | ORAL_CAPSULE | Freq: Three times a day (TID) | ORAL | 0 refills | Status: DC | PRN

## 2021-10-22 NOTE — Discharge Instructions (Addendum)
Take the Zithromax and Tessalon Perles as directed.    Follow up with your primary care provider if your symptoms are not improving.     

## 2021-10-22 NOTE — ED Provider Notes (Signed)
Roy Huffman    CSN: [id number] Arrival date & time: 10/22/21  1415      History   Chief Complaint Chief Complaint  Patient presents with   Cough    HPI Roy Huffman is a 26 y.o. male.  Patient presents with 3 to 4-day history of congestion and cough.  His symptoms have gotten worse and he feels like the cough is made due to his chest.  His cough is productive of yellow-brown phlegm occasionally.  He also reports ear pain and sore throat in the mornings which resolved by 10 AM.  No fever, chills, shortness of breath, vomiting, diarrhea, or other symptoms.  Treatment at home with OTC allergy medication.  No Tylenol or ibuprofen.  The history is provided by the patient and medical records.    Past Medical History:  Diagnosis Date   Acute medial meniscus tear    ADHD     Patient Active Problem List   Diagnosis Date Noted   COVID-19 virus infection 09/20/2018   DOE (dyspnea on exertion) 09/19/2018   Chronic cough 09/18/2018    Past Surgical History:  Procedure Laterality Date   DENTAL SURGERY     FOOT SURGERY     TONSILLECTOMY N/A 08/05/2021   Procedure: TONSILLECTOMY;  Surgeon: Linus Salmons, MD;  Location: [facility];  Service: ENT;  Laterality: N/A;       Home Medications    Prior to Admission medications   Medication Sig Start Date End Date Taking? Authorizing Provider  azithromycin (ZITHROMAX) 250 MG tablet Take 1 tablet (250 mg total) by mouth daily. Take first 2 tablets together, then 1 every day until finished. 10/22/21  Yes Mickie Bail, NP  benzonatate (TESSALON) 100 MG capsule Take 1 capsule (100 mg total) by mouth 3 (three) times daily as needed for cough. 10/22/21  Yes Mickie Bail, NP  HYDROcodone-acetaminophen (HYCET) 7.5-325 mg/15 ml solution Take 15 mLs by mouth 4 (four) times daily as needed for moderate pain. Patient not taking: Reported on 10/22/2021 08/05/21 08/05/22  Linus Salmons, MD  ibuprofen (ADVIL) 800 MG tablet Take 1  tablet (800 mg total) by mouth every 6 (six) hours as needed. 08/15/21   Candelaria Stagers, DPM  lidocaine (XYLOCAINE) 2 % solution Use as directed 15 mLs in the mouth or throat as needed for mouth pain (gargle and spit prn mouth pain). 08/05/21   Linus Salmons, MD  omeprazole (PRILOSEC OTC) 20 MG tablet Take 30- 60 min before your first and last meals of the day Patient not taking: Reported on 07/26/2021 09/19/18   Nyoka Cowden, MD  oxyCODONE-acetaminophen (PERCOCET) 5-325 MG tablet Take 1 tablet by mouth every 4 (four) hours as needed for severe pain. Patient not taking: Reported on 10/22/2021 08/15/21   Candelaria Stagers, DPM    Family History Family History  Problem Relation Age of Onset   Lung disease Neg Hx     Social History Social History   Tobacco Use   Smoking status: Former    Years: 10.00    Types: Cigarettes, E-cigarettes   Smokeless tobacco: Never  Vaping Use   Vaping Use: Every day   Start date: 05/08/2013   Substances: Nicotine, Flavoring  Substance Use Topics   Alcohol use: Yes    Alcohol/week: 1.0 - 2.0 standard drink of alcohol    Types: 1 - 2 Standard drinks or equivalent per week    Comment: once a week   Drug use: No  Allergies   Patient has no known allergies.   Review of Systems Review of Systems  Constitutional:  Negative for chills and fever.  HENT:  Positive for congestion, ear pain, postnasal drip and sore throat.   Respiratory:  Positive for cough. Negative for shortness of breath.   Cardiovascular:  Negative for chest pain and palpitations.  Gastrointestinal:  Negative for diarrhea and vomiting.  Skin:  Negative for color change and rash.  All other systems reviewed and are negative.    Physical Exam Triage Vital Signs ED Triage Vitals  Enc Vitals Group     BP      Pulse      Resp      Temp      Temp src      SpO2      Weight      Height      Head Circumference      Peak Flow      Pain Score      Pain Loc      Pain Edu?       Excl. in GC?    No data found.  Updated Vital Signs BP 129/75 (BP Location: Left Arm)   Pulse 92   Temp 98.4 F (36.9 C) (Oral)   Resp 18   SpO2 97%   Visual Acuity Right Eye Distance:   Left Eye Distance:   Bilateral Distance:    Right Eye Near:   Left Eye Near:    Bilateral Near:     Physical Exam Vitals and nursing note reviewed.  Constitutional:      General: He is not in acute distress.    Appearance: Normal appearance. He is well-developed. He is not ill-appearing.  HENT:     Right Ear: Tympanic membrane normal.     Left Ear: Tympanic membrane normal.     Nose: Nose normal.     Mouth/Throat:     Mouth: Mucous membranes are moist.     Pharynx: Oropharynx is clear.     Comments: Clear postnasal drip. Cardiovascular:     Rate and Rhythm: Normal rate and regular rhythm.     Heart sounds: Normal heart sounds.  Pulmonary:     Effort: Pulmonary effort is normal. No respiratory distress.     Breath sounds: Normal breath sounds. No wheezing, rhonchi or rales.  Musculoskeletal:     Cervical back: Neck supple.  Skin:    General: Skin is warm and dry.  Neurological:     Mental Status: He is alert.  Psychiatric:        Mood and Affect: Mood normal.        Behavior: Behavior normal.      UC Treatments / Results  Labs (all labs ordered are listed, but only abnormal results are displayed) Labs Reviewed - No data to display  EKG   Radiology No results found.  Procedures Procedures (including critical care time)  Medications Ordered in UC Medications - No data to display  Initial Impression / Assessment and Plan / UC Course  I have reviewed the triage vital signs and the nursing notes.  Pertinent labs & imaging results that were available during my care of the patient were reviewed by me and considered in my medical decision making (see chart for details).  Acute bronchitis.  Treating with Zithromax and Tessalon Perles.  Education provided on bronchitis.   ED precautions discussed.  Instructed patient to follow-up with his PCP if his symptoms are not  improving.  He is to plan of care.   Final Clinical Impressions(s) / UC Diagnoses   Final diagnoses:  Acute bronchitis, unspecified organism     Discharge Instructions      Take the Zithromax and Tessalon Perles as directed.  Follow up with your primary care provider if your symptoms are not improving.        ED Prescriptions     Medication Sig Dispense Auth. Provider   azithromycin (ZITHROMAX) 250 MG tablet Take 1 tablet (250 mg total) by mouth daily. Take first 2 tablets together, then 1 every day until finished. 6 tablet Mickie Bail, NP   benzonatate (TESSALON) 100 MG capsule Take 1 capsule (100 mg total) by mouth 3 (three) times daily as needed for cough. 21 capsule Mickie Bail, NP      PDMP not reviewed this encounter.   Mickie Bail, NP 10/22/21 1512

## 2021-10-22 NOTE — ED Triage Notes (Signed)
Wednesday started having head congestion.  Now congestion has moved to chest.  Patient has taken allergy medicine for 3 days and no improvement.  Patient has headache, coughing up yellow brown phlegm.

## 2022-04-26 ENCOUNTER — Ambulatory Visit
Admission: EM | Admit: 2022-04-26 | Discharge: 2022-04-26 | Disposition: A | Discharge status: Home / Self Care | Payer: 59 | Attending: Emergency Medicine | Admitting: Emergency Medicine

## 2022-04-26 DIAGNOSIS — Z1152 Encounter for screening for COVID-19: Secondary | ICD-10-CM | POA: Diagnosis not present

## 2022-04-26 DIAGNOSIS — R509 Fever, unspecified: Secondary | ICD-10-CM

## 2022-04-26 DIAGNOSIS — B349 Viral infection, unspecified: Secondary | ICD-10-CM

## 2022-04-26 DIAGNOSIS — F17290 Nicotine dependence, other tobacco product, uncomplicated: Secondary | ICD-10-CM | POA: Diagnosis not present

## 2022-04-26 DIAGNOSIS — R059 Cough, unspecified: Secondary | ICD-10-CM | POA: Diagnosis present

## 2022-04-26 NOTE — ED Provider Notes (Signed)
[facility]    CSN: [id number] Arrival date & time: 04/26/22  1011      History   Chief Complaint Chief Complaint  Patient presents with   Fever    HPI Roy Huffman is a 26 y.o. male.  Patient presents with fever, chills, scratchy throat, congestion, cough since last night.  Tmax 101.  Treatment at home with Tylenol.  He denies rash, shortness of breath, vomiting, diarrhea, or other symptoms.  His medical history includes chronic cough and dyspnea on exertion.  The history is provided by the patient and medical records.    Past Medical History:  Diagnosis Date   Acute medial meniscus tear    ADHD     Patient Active Problem List   Diagnosis Date Noted   Fever, unspecified 04/26/2022   COVID-19 virus infection 09/20/2018   DOE (dyspnea on exertion) 09/19/2018   Chronic cough 09/18/2018    Past Surgical History:  Procedure Laterality Date   DENTAL SURGERY     FOOT SURGERY     TONSILLECTOMY N/A 08/05/2021   Procedure: TONSILLECTOMY;  Surgeon: Beverly Gust, MD;  Location: [facility];  Service: ENT;  Laterality: N/A;       Home Medications    Prior to Admission medications   Medication Sig Start Date End Date Taking? Authorizing Provider  azithromycin (ZITHROMAX) 250 MG tablet Take 1 tablet (250 mg total) by mouth daily. Take first 2 tablets together, then 1 every day until finished. 10/22/21   Sharion Balloon, NP  benzonatate (TESSALON) 100 MG capsule Take 1 capsule (100 mg total) by mouth 3 (three) times daily as needed for cough. 10/22/21   Sharion Balloon, NP  HYDROcodone-acetaminophen (HYCET) 7.5-325 mg/15 ml solution Take 15 mLs by mouth 4 (four) times daily as needed for moderate pain. Patient not taking: Reported on 10/22/2021 08/05/21 08/05/22  Beverly Gust, MD  ibuprofen (ADVIL) 800 MG tablet Take 1 tablet (800 mg total) by mouth every 6 (six) hours as needed. 08/15/21   Felipa Furnace, DPM  lidocaine (XYLOCAINE) 2 % solution Use as  directed 15 mLs in the mouth or throat as needed for mouth pain (gargle and spit prn mouth pain). 08/05/21   Beverly Gust, MD  omeprazole (PRILOSEC OTC) 20 MG tablet Take 30- 60 min before your first and last meals of the day Patient not taking: Reported on 07/26/2021 09/19/18   Tanda Rockers, MD  oxyCODONE-acetaminophen (PERCOCET) 5-325 MG tablet Take 1 tablet by mouth every 4 (four) hours as needed for severe pain. Patient not taking: Reported on 10/22/2021 08/15/21   Felipa Furnace, DPM    Family History Family History  Problem Relation Age of Onset   Lung disease Neg Hx     Social History Social History   Tobacco Use   Smoking status: Former    Years: 10.00    Types: Cigarettes, E-cigarettes   Smokeless tobacco: Never  Vaping Use   Vaping Use: Every day   Start date: 05/08/2013   Substances: Nicotine, Flavoring  Substance Use Topics   Alcohol use: Yes    Alcohol/week: 1.0 - 2.0 standard drink of alcohol    Types: 1 - 2 Standard drinks or equivalent per week    Comment: once a week   Drug use: No     Allergies   Patient has no known allergies.   Review of Systems Review of Systems  Constitutional:  Positive for chills and fever.  HENT:  Positive  for congestion and sore throat. Negative for ear pain.   Respiratory:  Positive for cough. Negative for shortness of breath.   Cardiovascular:  Negative for chest pain and palpitations.  Gastrointestinal:  Negative for diarrhea and vomiting.  Skin:  Negative for color change and rash.  All other systems reviewed and are negative.    Physical Exam Triage Vital Signs ED Triage Vitals  Enc Vitals Group     BP 04/26/22 1114 118/64     Pulse Rate 04/26/22 1106 (!) 107     Resp 04/26/22 1106 18     Temp 04/26/22 1106 98.1 F (36.7 C)     Temp src --      SpO2 04/26/22 1106 97 %     Weight 04/26/22 1111 240 lb (108.9 kg)     Height 04/26/22 1111 6\' 1"  (1.854 m)     Head Circumference --      Peak Flow --      Pain  Score 04/26/22 1106 6     Pain Loc --      Pain Edu? --      Excl. in GC? --    No data found.  Updated Vital Signs BP 118/64   Pulse (!) 107   Temp 98.1 F (36.7 C)   Resp 18   Ht 6\' 1"  (1.854 m)   Wt 240 lb (108.9 kg)   SpO2 97%   BMI 31.66 kg/m   Visual Acuity Right Eye Distance:   Left Eye Distance:   Bilateral Distance:    Right Eye Near:   Left Eye Near:    Bilateral Near:     Physical Exam Vitals and nursing note reviewed.  Constitutional:      General: He is not in acute distress.    Appearance: He is well-developed. He is ill-appearing.  HENT:     Right Ear: Tympanic membrane normal.     Left Ear: Tympanic membrane normal.     Nose: Nose normal.     Mouth/Throat:     Mouth: Mucous membranes are moist.     Pharynx: Oropharynx is clear.  Cardiovascular:     Rate and Rhythm: Normal rate and regular rhythm.     Heart sounds: Normal heart sounds.  Pulmonary:     Effort: Pulmonary effort is normal. No respiratory distress.     Breath sounds: Normal breath sounds.  Musculoskeletal:     Cervical back: Neck supple.  Skin:    General: Skin is warm and dry.  Neurological:     Mental Status: He is alert.  Psychiatric:        Mood and Affect: Mood normal.        Behavior: Behavior normal.      UC Treatments / Results  Labs (all labs ordered are listed, but only abnormal results are displayed) Labs Reviewed  RESP PANEL BY RT-PCR (FLU A&B, COVID) [id number]    EKG   Radiology No results found.  Procedures Procedures (including critical care time)  Medications Ordered in UC Medications - No data to display  Initial Impression / Assessment and Plan / UC Course  I have reviewed the triage vital signs and the nursing notes.  Pertinent labs & imaging results that were available during my care of the patient were reviewed by me and considered in my medical decision making (see chart for details).    Viral illness.  COVID and flu pending.  Discussed  symptomatic treatment including Tylenol or ibuprofen, rest, hydration.  Instructed patient to follow up with PCP if symptoms are not improving.  He agrees to plan of care.   Final Clinical Impressions(s) / UC Diagnoses   Final diagnoses:  Fever, unspecified  Viral illness     Discharge Instructions      Your COVID and Flu tests are pending.    Take Tylenol or ibuprofen as needed for fever or discomfort.  Rest and keep yourself hydrated.    Follow-up with your primary care provider if your symptoms are not improving.         ED Prescriptions   None    PDMP not reviewed this encounter.   Sharion Balloon, NP 04/26/22 1213

## 2022-04-26 NOTE — Discharge Instructions (Addendum)
Your COVID and Flu tests are pending.    Take Tylenol or ibuprofen as needed for fever or discomfort.  Rest and keep yourself hydrated.    Follow-up with your primary care provider if your symptoms are not improving.     

## 2022-04-26 NOTE — ED Triage Notes (Addendum)
Patient to Urgent Care with complaints of fever, scratchy throat, and chest congestion. Symptoms started last night.  Fever approx 3hrs ago of 101. Has been taking tylenol.

## 2022-04-27 LAB — RESP PANEL BY RT-PCR (FLU A&B, COVID) ARPGX2
Influenza A by PCR: NEGATIVE
Influenza B by PCR: NEGATIVE
SARS Coronavirus 2 by RT PCR: NEGATIVE

## 2022-05-03 ENCOUNTER — Ambulatory Visit
Admission: EM | Admit: 2022-05-03 | Discharge: 2022-05-03 | Disposition: A | Discharge status: Home / Self Care | Payer: 59 | Attending: Urgent Care | Admitting: Urgent Care

## 2022-05-03 DIAGNOSIS — B9689 Other specified bacterial agents as the cause of diseases classified elsewhere: Secondary | ICD-10-CM | POA: Diagnosis not present

## 2022-05-03 DIAGNOSIS — J019 Acute sinusitis, unspecified: Secondary | ICD-10-CM

## 2022-05-03 MED ORDER — PREDNISONE 20 MG PO TABS: ORAL_TABLET | ORAL | 0 refills | Status: AC

## 2022-05-03 MED ORDER — AMOXICILLIN-POT CLAVULANATE 875-125 MG PO TABS: 1.0000 | ORAL_TABLET | Freq: Two times a day (BID) | ORAL | 0 refills | Status: DC

## 2022-05-03 NOTE — Discharge Instructions (Addendum)
Follow up here or with your primary care provider if your symptoms are worsening or not improving with treatment.     

## 2022-05-03 NOTE — ED Provider Notes (Signed)
Roy Huffman    CSN: [id number] Arrival date & time: 05/03/22  1335      History   Chief Complaint Chief Complaint  Patient presents with   Facial Pain   Fever   Cough   Laryngitis    HPI Roy Huffman is a 26 y.o. male.    Fever Associated symptoms: cough   Cough Associated symptoms: fever     Presents to urgent care with complaint of sinus pressure, fever, loss of voice.  Patient was seen here a week ago and treated for viral infection.  He states symptoms started to improve but have returned and worsened.  Past Medical History:  Diagnosis Date   Acute medial meniscus tear    ADHD     Patient Active Problem List   Diagnosis Date Noted   Fever, unspecified 04/26/2022   COVID-19 virus infection 09/20/2018   DOE (dyspnea on exertion) 09/19/2018   Chronic cough 09/18/2018    Past Surgical History:  Procedure Laterality Date   DENTAL SURGERY     FOOT SURGERY     TONSILLECTOMY N/A 08/05/2021   Procedure: TONSILLECTOMY;  Surgeon: Linus Salmons, MD;  Location: [facility];  Service: ENT;  Laterality: N/A;       Home Medications    Prior to Admission medications   Medication Sig Start Date End Date Taking? Authorizing Provider  azithromycin (ZITHROMAX) 250 MG tablet Take 1 tablet (250 mg total) by mouth daily. Take first 2 tablets together, then 1 every day until finished. 10/22/21   Mickie Bail, NP  benzonatate (TESSALON) 100 MG capsule Take 1 capsule (100 mg total) by mouth 3 (three) times daily as needed for cough. 10/22/21   Mickie Bail, NP  HYDROcodone-acetaminophen (HYCET) 7.5-325 mg/15 ml solution Take 15 mLs by mouth 4 (four) times daily as needed for moderate pain. Patient not taking: Reported on 10/22/2021 08/05/21 08/05/22  Linus Salmons, MD  ibuprofen (ADVIL) 800 MG tablet Take 1 tablet (800 mg total) by mouth every 6 (six) hours as needed. 08/15/21   Candelaria Stagers, DPM  lidocaine (XYLOCAINE) 2 % solution Use as directed 15  mLs in the mouth or throat as needed for mouth pain (gargle and spit prn mouth pain). 08/05/21   Linus Salmons, MD  omeprazole (PRILOSEC OTC) 20 MG tablet Take 30- 60 min before your first and last meals of the day Patient not taking: Reported on 07/26/2021 09/19/18   Nyoka Cowden, MD  oxyCODONE-acetaminophen (PERCOCET) 5-325 MG tablet Take 1 tablet by mouth every 4 (four) hours as needed for severe pain. Patient not taking: Reported on 10/22/2021 08/15/21   Candelaria Stagers, DPM    Family History Family History  Problem Relation Age of Onset   Lung disease Neg Hx     Social History Social History   Tobacco Use   Smoking status: Former    Years: 10.00    Types: Cigarettes, E-cigarettes   Smokeless tobacco: Never  Vaping Use   Vaping Use: Every day   Start date: 05/08/2013   Substances: Nicotine, Flavoring  Substance Use Topics   Alcohol use: Yes    Alcohol/week: 1.0 - 2.0 standard drink of alcohol    Types: 1 - 2 Standard drinks or equivalent per week    Comment: once a week   Drug use: No     Allergies   Patient has no known allergies.   Review of Systems Review of Systems  Constitutional:  Positive for  fever.  Respiratory:  Positive for cough.      Physical Exam Triage Vital Signs ED Triage Vitals [05/03/22 1513]  Enc Vitals Group     BP 107/69     Pulse Rate (!) 106     Resp 17     Temp (!) 103.6 F (39.8 C)     Temp src      SpO2 97 %     Weight      Height      Head Circumference      Peak Flow      Pain Score 0     Pain Loc      Pain Edu?      Excl. in GC?    No data found.  Updated Vital Signs BP 107/69   Pulse (!) 106   Temp (!) 103.6 F (39.8 C)   Resp 17   SpO2 97%   Visual Acuity Right Eye Distance:   Left Eye Distance:   Bilateral Distance:    Right Eye Near:   Left Eye Near:    Bilateral Near:     Physical Exam Vitals reviewed.  Constitutional:      Appearance: Normal appearance.  HENT:     Nose:     Right Sinus:  Maxillary sinus tenderness and frontal sinus tenderness present.     Left Sinus: Maxillary sinus tenderness and frontal sinus tenderness present.  Skin:    General: Skin is warm and dry.  Neurological:     General: No focal deficit present.     Mental Status: He is alert and oriented to person, place, and time.  Psychiatric:        Mood and Affect: Mood normal.        Behavior: Behavior normal.      UC Treatments / Results  Labs (all labs ordered are listed, but only abnormal results are displayed) Labs Reviewed - No data to display  EKG   Radiology No results found.  Procedures Procedures (including critical care time)  Medications Ordered in UC Medications - No data to display  Initial Impression / Assessment and Plan / UC Course  I have reviewed the triage vital signs and the nursing notes.  Pertinent labs & imaging results that were available during my care of the patient were reviewed by me and considered in my medical decision making (see chart for details).   Patient is febrile here with recent antipyretics. Satting well on room air, elevated heart rate of 106 bpm. Overall is ill appearing, well hydrated, without respiratory distress. Pulmonary exam is unremarkable.  Bilateral maxillary and frontal sinus tenderness with palpation.  Treating today with Augmentin for secondary bacterial sinus infection.  Final Clinical Impressions(s) / UC Diagnoses   Final diagnoses:  None   Discharge Instructions   None    ED Prescriptions   None    PDMP not reviewed this encounter.   Charma Igo, [place] 05/03/22 [phone]

## 2022-05-03 NOTE — ED Triage Notes (Signed)
Pt. Presents to UC w/ c/o sinus pressure, a fever, a cough and loss of voice. Pt. States he was seen here a week ago and told it was a viral infection. Pt. States symptoms started to get better but have returned and worsened.

## 2022-05-05 ENCOUNTER — Emergency Department
Admission: EM | Admit: 2022-05-05 | Discharge: 2022-05-05 | Disposition: A | Discharge status: Home / Self Care | Payer: 59 | Attending: Emergency Medicine | Admitting: Emergency Medicine

## 2022-05-05 ENCOUNTER — Emergency Department: Payer: 59

## 2022-05-05 ENCOUNTER — Encounter: Payer: Self-pay | Admitting: Emergency Medicine

## 2022-05-05 ENCOUNTER — Other Ambulatory Visit: Payer: Self-pay

## 2022-05-05 DIAGNOSIS — Z20822 Contact with and (suspected) exposure to covid-19: Secondary | ICD-10-CM | POA: Diagnosis not present

## 2022-05-05 DIAGNOSIS — J111 Influenza due to unidentified influenza virus with other respiratory manifestations: Secondary | ICD-10-CM

## 2022-05-05 DIAGNOSIS — R059 Cough, unspecified: Secondary | ICD-10-CM | POA: Diagnosis present

## 2022-05-05 DIAGNOSIS — J101 Influenza due to other identified influenza virus with other respiratory manifestations: Secondary | ICD-10-CM | POA: Insufficient documentation

## 2022-05-05 LAB — RESP PANEL BY RT-PCR (RSV, FLU A&B, COVID)  RVPGX2
Influenza A by PCR: POSITIVE — AB
Influenza B by PCR: NEGATIVE
Resp Syncytial Virus by PCR: NEGATIVE
SARS Coronavirus 2 by RT PCR: NEGATIVE

## 2022-05-05 NOTE — ED Provider Notes (Signed)
[facility] Provider Note    Event Date/Time   First MD Initiated Contact with Patient 05/05/22 [phone]     (approximate)   History   Fever   HPI  Roy Huffman is a 26 y.o. male with a past medical history of obesity who presents today for evaluation of cough, nasal congestion, and bodyaches for the past week.  Patient reports that his father is sick with similar symptoms.  He reports that he has had a fever as well.  He denies sore throat, trouble swallowing, trouble breathing, chest pain, abdominal pain, nausea, vomiting, or diarrhea.     Physical Exam   Triage Vital Signs: ED Triage Vitals  Enc Vitals Group     BP 05/05/22 0758 116/68     Pulse Rate 05/05/22 0758 (!) 107     Resp 05/05/22 0758 18     Temp 05/05/22 0758 100.3 F (37.9 C)     Temp Source 05/05/22 0758 Oral     SpO2 05/05/22 0758 96 %     Weight 05/05/22 0759 245 lb (111.1 kg)     Height 05/05/22 0759 6\' 1"  (1.854 m)     Head Circumference --      Peak Flow --      Pain Score 05/05/22 0757 5     Pain Loc --      Pain Edu? --      Excl. in GC? --     Most recent vital signs: Vitals:   05/05/22 0758  BP: 116/68  Pulse: (!) 107  Resp: 18  Temp: 100.3 F (37.9 C)  SpO2: 96%    Physical Exam Vitals and nursing note reviewed.  Constitutional:      General: Awake and alert. No acute distress.    Appearance: Normal appearance. The patient is overweight.  HENT:     Head: Normocephalic and atraumatic.     Mouth: Mucous membranes are moist.  Eyes:     General: PERRL. Normal EOMs        Right eye: No discharge.        Left eye: No discharge.     Conjunctiva/sclera: Conjunctivae normal.  Cardiovascular:     Rate and Rhythm: Normal rate and regular rhythm.     Pulses: Normal pulses.     Heart sounds: Normal heart sounds Pulmonary:     Effort: Pulmonary effort is normal. No respiratory distress.  Able to speak easily in complete sentences    Breath sounds: Normal breath  sounds.  Abdominal:     Abdomen is soft. There is no abdominal tenderness. No rebound or guarding. No distention. Musculoskeletal:        General: No swelling. Normal range of motion.     Cervical back: Normal range of motion and neck supple.  Skin:    General: Skin is warm and dry.     Capillary Refill: Capillary refill takes less than 2 seconds.     Findings: No rash.  Neurological:     Mental Status: The patient is awake and alert.      ED Results / Procedures / Treatments   Labs (all labs ordered are listed, but only abnormal results are displayed) Labs Reviewed  RESP PANEL BY RT-PCR (RSV, FLU A&B, COVID)  RVPGX2 - Abnormal; Notable for the following components:      Result Value   Influenza A by PCR POSITIVE (*)    All other components within normal limits  EKG     RADIOLOGY I independently reviewed and interpreted imaging and agree with radiologists findings.     PROCEDURES:  Critical Care performed:   Procedures   MEDICATIONS ORDERED IN ED: Medications - No data to display   IMPRESSION / MDM / ASSESSMENT AND PLAN / ED COURSE  I reviewed the triage vital signs and the nursing notes.   Differential diagnosis includes, but is not limited to, influenza, COVID-19, bronchitis, pneumonia, other viral URI.  Patient is awake and alert, hemodynamically stable and afebrile.  He is nontoxic in appearance.  Swabs obtained in triage are positive for influenza.  Chest x-ray also obtained in triage is negative for any acute cardiopulmonary abnormality.  No fever, chest pain, or shortness of breath to suggest myocarditis.  Discussed these results with the patient and advised that he is highly contagious to others.  Also discussed symptomatic management and return precautions.  Patient understands and agrees with plan.  He was discharged in stable condition.   Patient's presentation is most consistent with acute complicated illness / injury requiring diagnostic  workup.     FINAL CLINICAL IMPRESSION(S) / ED DIAGNOSES   Final diagnoses:  Influenza     Rx / DC Orders   ED Discharge Orders     None        Note:  This document was prepared using Dragon voice recognition software and may include unintentional dictation errors.   Keturah Shavers 05/05/22 1119    Jene Every, MD 05/05/22 1444

## 2022-05-05 NOTE — ED Triage Notes (Signed)
Pt comes with c/o fever, cough congestion. Pt went to UC and was seen. Pt has had stuffy nose and sinus pressure with tooth ache. Pt states he was dx with laryngitis between all of this too.

## 2022-05-05 NOTE — Discharge Instructions (Addendum)
You are diagnosed with the flu.  You may continue to take Tylenol/ibuprofen per package instructions to help with your symptoms.  Please return for any new, worsening, or change in symptoms or other concerns.

## 2022-05-23 ENCOUNTER — Telehealth: Payer: Self-pay | Admitting: Podiatry

## 2022-05-23 NOTE — Telephone Encounter (Signed)
Spoke with patient he is going to stop by the [place] office to pick up orthotics.

## 2022-05-23 NOTE — Telephone Encounter (Signed)
Called patient back , per Sindy Messing we can't give out these orthotics because they are over a year old and things change with the patient and we can't give out old orthotics.

## 2022-11-24 IMAGING — US US EXTREM LOW*L* LIMITED
1 series · 14 of 19 positions shown · non-contrast
Comparison: None.

CLINICAL DATA: Left lower quadrant pain

EXAM:
LEFT LOWER EXTREMITY SOFT TISSUE ULTRASOUND LIMITED
TECHNIQUE: Ultrasound examination was performed including evaluation of the
muscles, tendons, joint, and adjacent soft tissues.

[Series 1: us extrem low*left* limited · 0.09mm/px · 19 acquisitions, 14 frames shown]
[im 1/19]
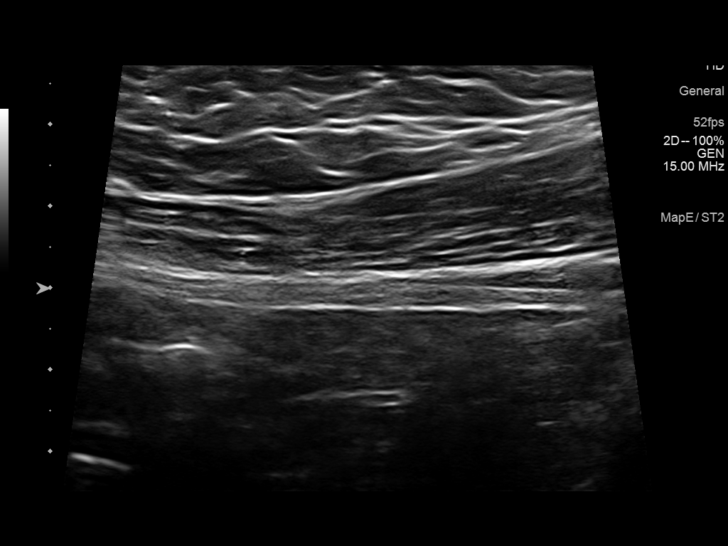
[im 3/19]
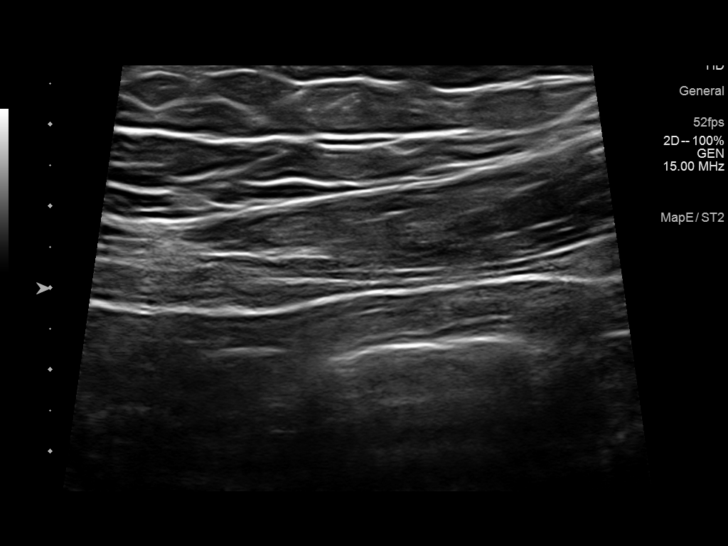
[im 4/19]
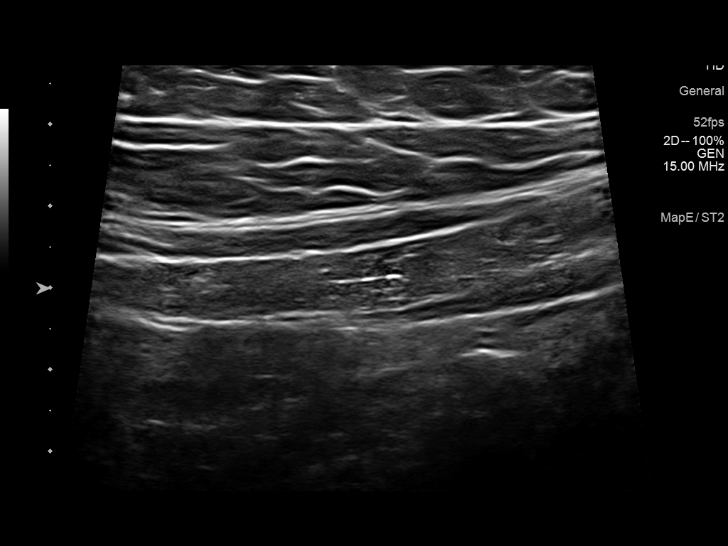
[im 5/19]
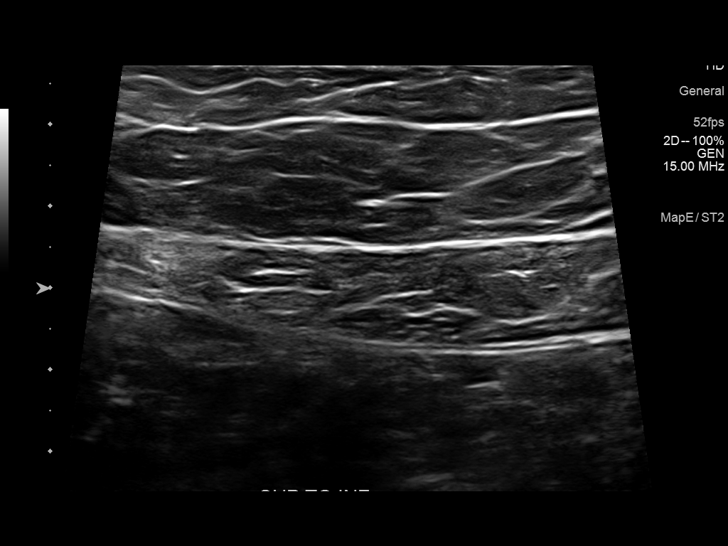
[im 7/19]
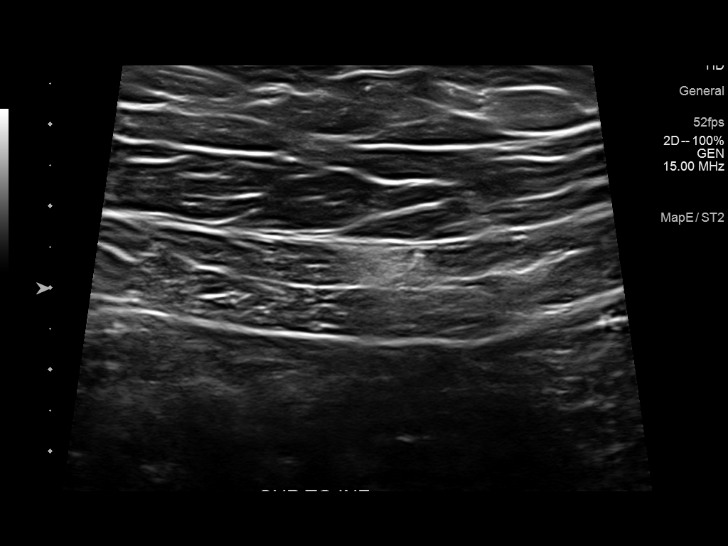
[im 8/19]
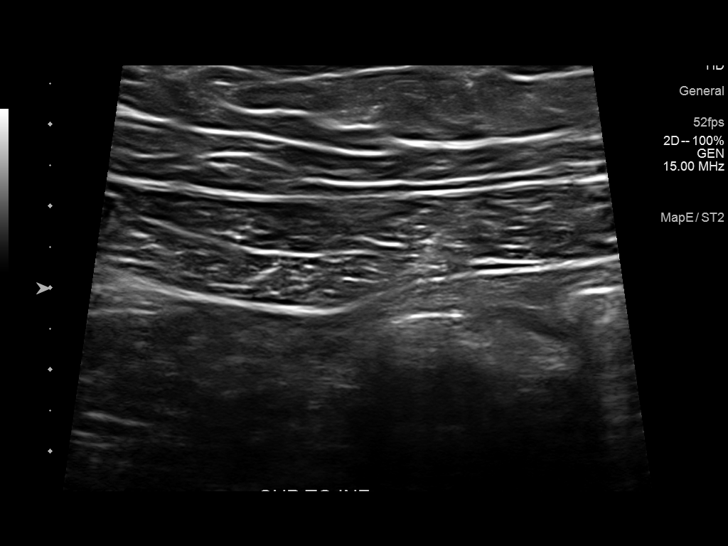
[im 9/19]
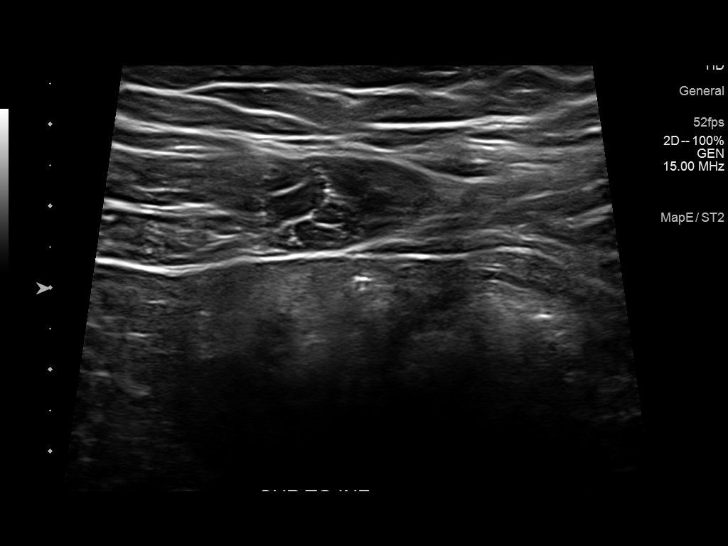
[im 11/19]
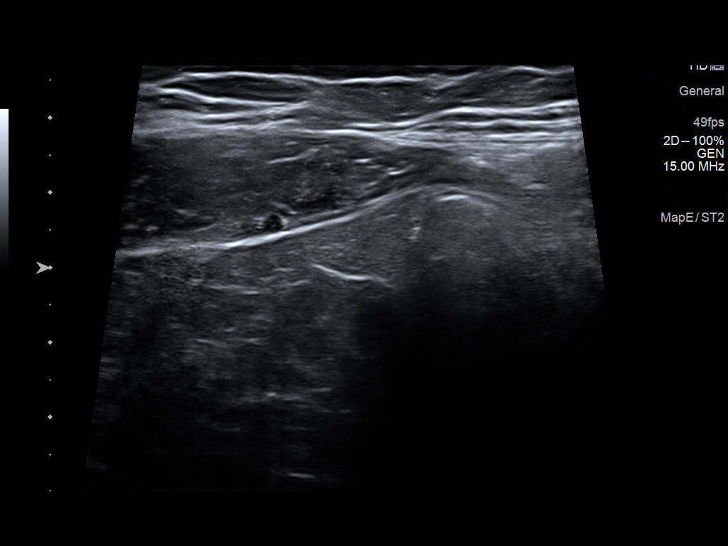
[im 12/19]
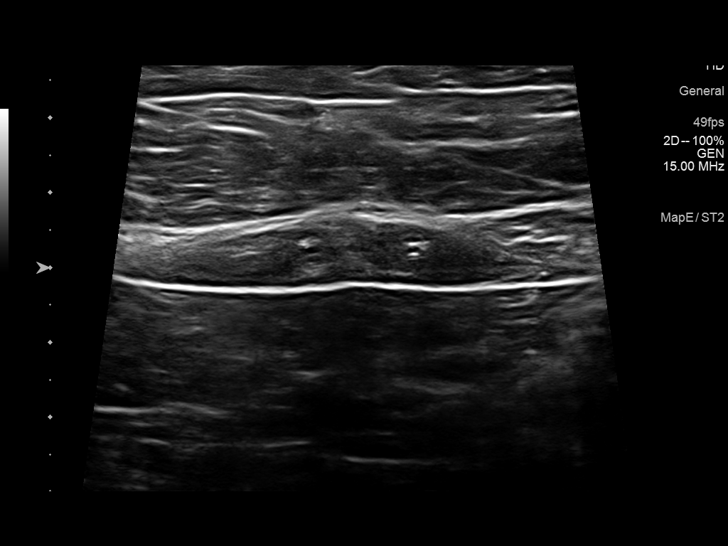
[im 13/19]
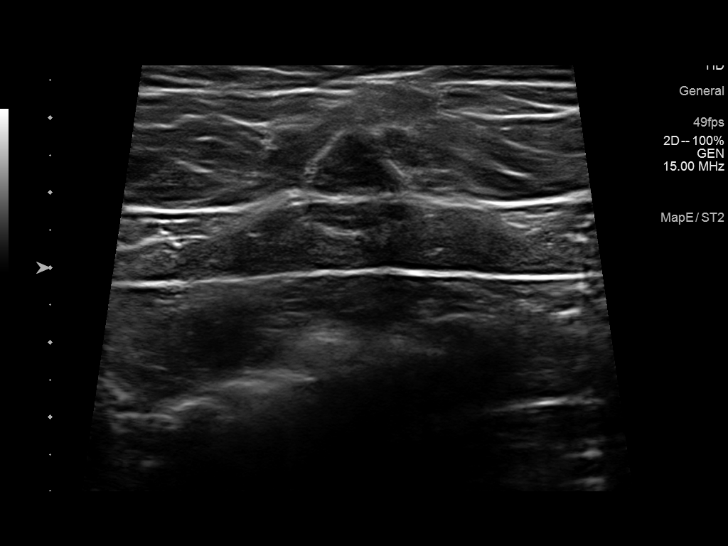
[im 15/19]
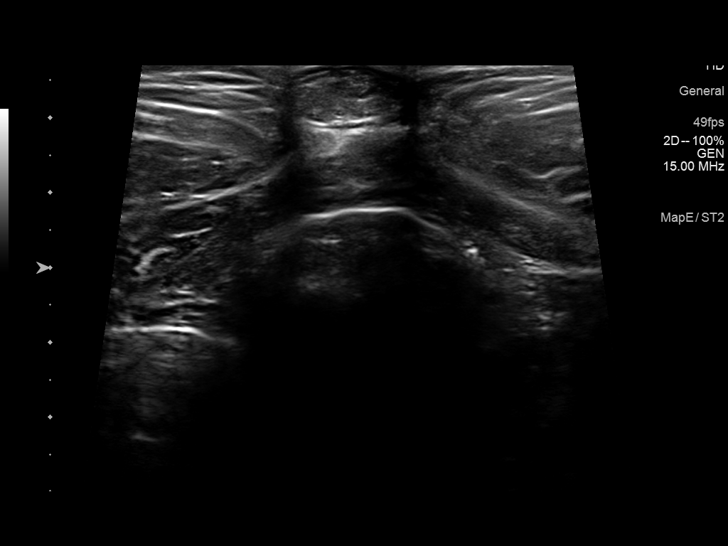
[im 16/19]
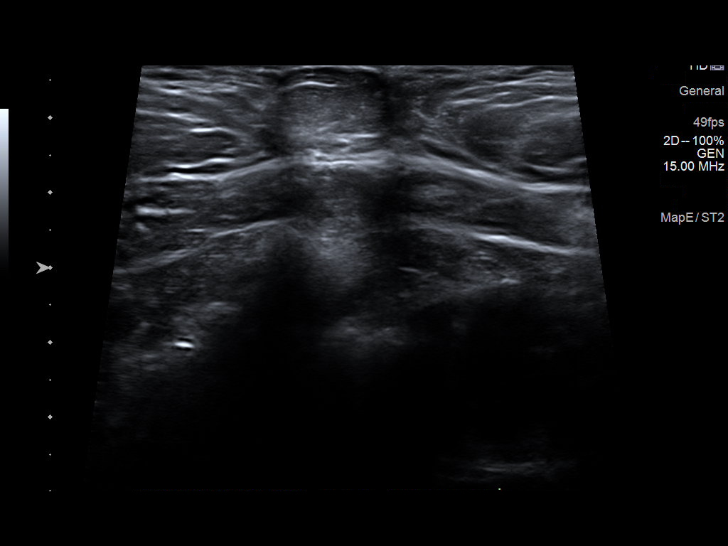
[im 17/19]
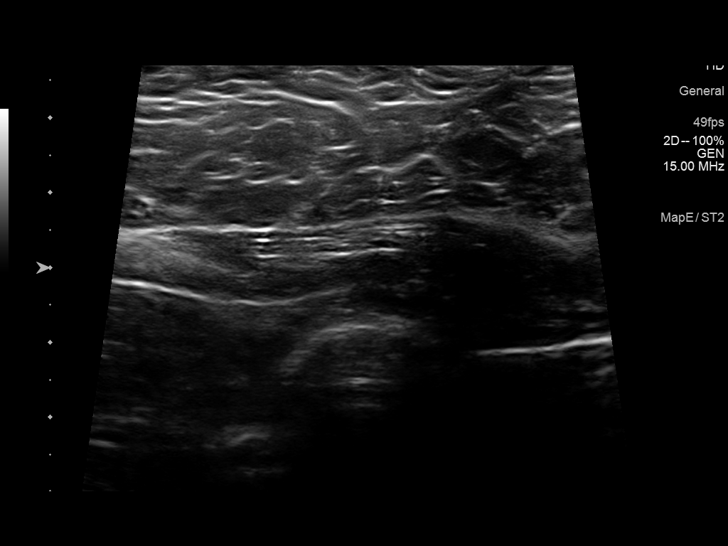
[im 19/19]
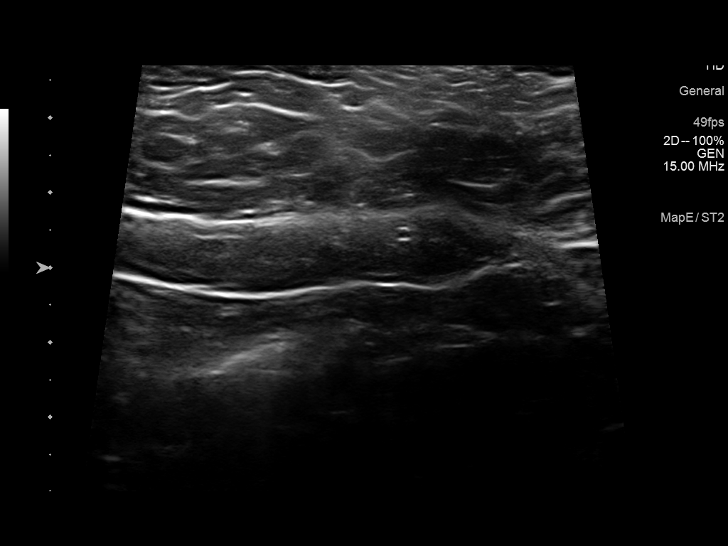

[14 of 19 positions shown; findings below may reference images not displayed]

FINDINGS: Targeted ultrasound of the left lower quadrant is performed in the
region of pain. No cystic or solid mass is visualized. There is no
bowel containing hernia.
IMPRESSION: Negative for hernia in the region of left lower quadrant pain

## 2023-02-09 ENCOUNTER — Ambulatory Visit: Payer: 59 | Admitting: Family Medicine

## 2023-02-21 ENCOUNTER — Ambulatory Visit
Admission: RE | Admit: 2023-02-21 | Discharge: 2023-02-21 | Disposition: A | Discharge status: Home / Self Care | Payer: 59 | Source: Ambulatory Visit | Attending: Emergency Medicine | Admitting: Emergency Medicine

## 2023-02-21 VITALS — BP 112/70 | HR 80 | Temp 98.2°F | Resp 18

## 2023-02-21 DIAGNOSIS — Z1152 Encounter for screening for COVID-19: Secondary | ICD-10-CM | POA: Diagnosis not present

## 2023-02-21 DIAGNOSIS — R509 Fever, unspecified: Secondary | ICD-10-CM | POA: Diagnosis present

## 2023-02-21 DIAGNOSIS — J069 Acute upper respiratory infection, unspecified: Secondary | ICD-10-CM | POA: Diagnosis not present

## 2023-02-21 DIAGNOSIS — R0981 Nasal congestion: Secondary | ICD-10-CM | POA: Diagnosis present

## 2023-02-21 NOTE — ED Triage Notes (Addendum)
Pt c/o fever, fatigue, and nasal congestion since yesterday. Pt did not take his temperature and states he had chills earlier today. Pt has tried OTC medication today.

## 2023-02-21 NOTE — Discharge Instructions (Addendum)
Your symptoms today are most likely being caused by a virus and should steadily improve in time it can take up to 7 to 10 days before you truly start to see a turnaround however things will get better  covid est is pending up to 24 hours, you will be notified of positive results only, if positive you will need to stay home until without fever, if no fever may continue activity wearing mask    You can take Tylenol and/or Ibuprofen as needed for fever reduction and pain relief.   For cough: honey 1/2 to 1 teaspoon (you can dilute the honey in water or another fluid).  You can also use guaifenesin and dextromethorphan for cough. You can use a humidifier for chest congestion and cough.  If you don't have a humidifier, you can sit in the bathroom with the hot shower running.      For sore throat: try warm salt water gargles, cepacol lozenges, throat spray, warm tea or water with lemon/honey, popsicles or ice, or OTC cold relief medicine for throat discomfort.   For congestion: take a daily anti-histamine like Zyrtec, Claritin, and a oral decongestant, such as pseudoephedrine.  You can also use Flonase 1-2 sprays in each nostril daily.   It is important to stay hydrated: drink plenty of fluids (water, gatorade/powerade/pedialyte, juices, or teas) to keep your throat moisturized and help further relieve irritation/discomfort.

## 2023-02-21 NOTE — ED Provider Notes (Signed)
Roy Huffman    CSN: [id number] Arrival date & time: 02/21/23  1805      History   Chief Complaint Chief Complaint  Patient presents with   Fever    Fever and fatigued nose is getting stuffy - Entered by patient   Nasal Congestion   Fatigue    HPI Roy Huffman is a 27 y.o. male.   Presents for evaluation of subjective fever and nasal congestion beginning today.  Accompanying fatigue.  Had one episode of nausea which has resolved.  Tolerating food and liquids.  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  Denies headache, ear pain, sore throat, cough, abdominal symptoms.  Has not attempted treatment.  Past Medical History:  Diagnosis Date   Acute medial meniscus tear    ADHD     Patient Active Problem List   Diagnosis Date Noted   Fever, unspecified 04/26/2022   COVID-19 virus infection 09/20/2018   DOE (dyspnea on exertion) 09/19/2018   Chronic cough 09/18/2018    Past Surgical History:  Procedure Laterality Date   DENTAL SURGERY     FOOT SURGERY     TONSILLECTOMY N/A 08/05/2021   Procedure: TONSILLECTOMY;  Surgeon: Linus Salmons, MD;  Location: [facility];  Service: ENT;  Laterality: N/A;       Home Medications    Prior to Admission medications   Medication Sig Start Date End Date Taking? Authorizing Provider  amoxicillin-clavulanate (AUGMENTIN) 875-125 MG tablet Take 1 tablet by mouth every 12 (twelve) hours. 05/03/22   Immordino, Jeannett Senior, FNP  azithromycin (ZITHROMAX) 250 MG tablet Take 1 tablet (250 mg total) by mouth daily. Take first 2 tablets together, then 1 every day until finished. 10/22/21   Mickie Bail, NP  benzonatate (TESSALON) 100 MG capsule Take 1 capsule (100 mg total) by mouth 3 (three) times daily as needed for cough. 10/22/21   Mickie Bail, NP  ibuprofen (ADVIL) 800 MG tablet Take 1 tablet (800 mg total) by mouth every 6 (six) hours as needed. 08/15/21   Candelaria Stagers, DPM  lidocaine (XYLOCAINE) 2 %  solution Use as directed 15 mLs in the mouth or throat as needed for mouth pain (gargle and spit prn mouth pain). 08/05/21   Linus Salmons, MD  omeprazole (PRILOSEC OTC) 20 MG tablet Take 30- 60 min before your first and last meals of the day Patient not taking: Reported on 07/26/2021 09/19/18   Nyoka Cowden, MD  oxyCODONE-acetaminophen (PERCOCET) 5-325 MG tablet Take 1 tablet by mouth every 4 (four) hours as needed for severe pain. Patient not taking: Reported on 10/22/2021 08/15/21   Candelaria Stagers, DPM    Family History Family History  Problem Relation Age of Onset   Lung disease Neg Hx     Social History Social History   Tobacco Use   Smoking status: Former    Types: Cigarettes, E-cigarettes   Smokeless tobacco: Never  Vaping Use   Vaping status: Every Day   Start date: 05/08/2013   Substances: Nicotine, Flavoring  Substance Use Topics   Alcohol use: Yes    Alcohol/week: 1.0 - 2.0 standard drink of alcohol    Types: 1 - 2 Standard drinks or equivalent per week    Comment: once a week   Drug use: No     Allergies   Patient has no known allergies.   Review of Systems Review of Systems   Physical Exam Triage Vital Signs ED Triage Vitals  Encounter Vitals Group     BP 02/21/23 1820 112/70     Systolic BP Percentile --      Diastolic BP Percentile --      Pulse Rate 02/21/23 1820 80     Resp 02/21/23 1820 18     Temp 02/21/23 1820 98.2 F (36.8 C)     Temp Source 02/21/23 1820 Oral     SpO2 02/21/23 1820 96 %     Weight --      Height --      Head Circumference --      Peak Flow --      Pain Score 02/21/23 1825 0     Pain Loc --      Pain Education --      Exclude from Growth Chart --    No data found.  Updated Vital Signs BP 112/70 (BP Location: Left Arm)   Pulse 80   Temp 98.2 F (36.8 C) (Oral)   Resp 18   SpO2 96%   Visual Acuity Right Eye Distance:   Left Eye Distance:   Bilateral Distance:    Right Eye Near:   Left Eye Near:     Bilateral Near:     Physical Exam Constitutional:      Appearance: Normal appearance.  HENT:     Head: Normocephalic.     Right Ear: Tympanic membrane, ear canal and external ear normal.     Left Ear: Tympanic membrane, ear canal and external ear normal.     Nose: Congestion present. No rhinorrhea.     Mouth/Throat:     Mouth: Mucous membranes are moist.     Pharynx: Oropharynx is clear. No oropharyngeal exudate or posterior oropharyngeal erythema.  Eyes:     Extraocular Movements: Extraocular movements intact.  Cardiovascular:     Rate and Rhythm: Normal rate and regular rhythm.     Pulses: Normal pulses.     Heart sounds: Normal heart sounds.  Pulmonary:     Effort: Pulmonary effort is normal.     Breath sounds: Normal breath sounds.  Musculoskeletal:     Cervical back: Normal range of motion and neck supple.  Neurological:     Mental Status: He is alert and oriented to person, place, and time. Mental status is at baseline.      UC Treatments / Results  Labs (all labs ordered are listed, but only abnormal results are displayed) Labs Reviewed  SARS CORONAVIRUS 2 (TAT 6-24 HRS)    EKG   Radiology No results found.  Procedures Procedures (including critical care time)  Medications Ordered in UC Medications - No data to display  Initial Impression / Assessment and Plan / UC Course  I have reviewed the triage vital signs and the nursing notes.  Pertinent labs & imaging results that were available during my care of the patient were reviewed by me and considered in my medical decision making (see chart for details).  Viral URI  Patient is in no signs of distress nor toxic appearing.  Vital signs are stable.  Low suspicion for pneumonia, pneumothorax or bronchitis and therefore will defer imaging.COVID test is pending, reviewed quarantine guidelines per CDC recommendations.May use additional over-the-counter medications as needed for supportive care.  May follow-up  with urgent care as needed if symptoms persist or worsen.  Note given.   Final Clinical Impressions(s) / UC Diagnoses   Final diagnoses:  Viral URI     Discharge Instructions  Your symptoms today are most likely being caused by a virus and should steadily improve in time it can take up to 7 to 10 days before you truly start to see a turnaround however things will get better  covid est is pending up to 24 hours, you will be notified of positive results only, if positive you will need to stay home until without fever, if no fever may continue activity wearing mask    You can take Tylenol and/or Ibuprofen as needed for fever reduction and pain relief.   For cough: honey 1/2 to 1 teaspoon (you can dilute the honey in water or another fluid).  You can also use guaifenesin and dextromethorphan for cough. You can use a humidifier for chest congestion and cough.  If you don't have a humidifier, you can sit in the bathroom with the hot shower running.      For sore throat: try warm salt water gargles, cepacol lozenges, throat spray, warm tea or water with lemon/honey, popsicles or ice, or OTC cold relief medicine for throat discomfort.   For congestion: take a daily anti-histamine like Zyrtec, Claritin, and a oral decongestant, such as pseudoephedrine.  You can also use Flonase 1-2 sprays in each nostril daily.   It is important to stay hydrated: drink plenty of fluids (water, gatorade/powerade/pedialyte, juices, or teas) to keep your throat moisturized and help further relieve irritation/discomfort.    ED Prescriptions   None    PDMP not reviewed this encounter.   Valinda Hoar, NP 02/21/23 [phone]

## 2023-02-22 LAB — SARS CORONAVIRUS 2 (TAT 6-24 HRS): SARS Coronavirus 2: NEGATIVE

## 2023-03-16 ENCOUNTER — Encounter: Payer: Self-pay | Admitting: Nurse Practitioner

## 2023-03-16 ENCOUNTER — Other Ambulatory Visit: Payer: Self-pay

## 2023-03-16 ENCOUNTER — Ambulatory Visit: Payer: 59 | Admitting: Nurse Practitioner

## 2023-03-16 VITALS — BP 120/70 | HR 98 | Temp 98.7°F | Resp 18 | Ht 73.0 in | Wt 249.5 lb

## 2023-03-16 DIAGNOSIS — Z23 Encounter for immunization: Secondary | ICD-10-CM

## 2023-03-16 DIAGNOSIS — Z130 Encounter for screening for diseases of the blood and blood-forming organs and certain disorders involving the immune mechanism: Secondary | ICD-10-CM

## 2023-03-16 DIAGNOSIS — Z1159 Encounter for screening for other viral diseases: Secondary | ICD-10-CM

## 2023-03-16 DIAGNOSIS — Z13220 Encounter for screening for lipoid disorders: Secondary | ICD-10-CM

## 2023-03-16 DIAGNOSIS — Z114 Encounter for screening for human immunodeficiency virus [HIV]: Secondary | ICD-10-CM

## 2023-03-16 DIAGNOSIS — Z0000 Encounter for general adult medical examination without abnormal findings: Secondary | ICD-10-CM | POA: Diagnosis not present

## 2023-03-16 DIAGNOSIS — Z131 Encounter for screening for diabetes mellitus: Secondary | ICD-10-CM

## 2023-03-16 DIAGNOSIS — Z7689 Persons encountering health services in other specified circumstances: Secondary | ICD-10-CM

## 2023-03-16 NOTE — Progress Notes (Signed)
Name: Roy Huffman   MRN: [id number]    DOB: 1995/07/04   Date:03/16/2023       Progress Note  Subjective  Chief Complaint  Chief Complaint  Patient presents with   Establish Care    HPI  Patient presents for annual CPE and establish care.  Establish care: his last physical was never.  Medical history includes none.  Family history includes none.  Health Maintenance due for labs.    Diet: he eats everything Exercise: physical job, recommend 150 min of physical activity weekly   Sleep: 6-9 hours Last dental exam:unknown Last eye exam: last year  Depression: phq 9 is negative    03/16/2023   10:15 AM 02/06/2017    2:51 PM  Depression screen PHQ 2/9  Decreased Interest 0 1  Down, Depressed, Hopeless 0 0  PHQ - 2 Score 0 1  Altered sleeping  3  Tired, decreased energy  0  Change in appetite  0  Feeling bad or failure about yourself   0  Trouble concentrating  0  Moving slowly or fidgety/restless  0  Suicidal thoughts  0  PHQ-9 Score  4  Difficult doing work/chores  Not difficult at all    Hypertension:  BP Readings from Last 3 Encounters:  03/16/23 120/70  02/21/23 112/70  05/05/22 116/68    Obesity: Wt Readings from Last 3 Encounters:  03/16/23 249 lb 8 oz (113.2 kg)  05/05/22 245 lb (111.1 kg)  04/26/22 240 lb (108.9 kg)   BMI Readings from Last 3 Encounters:  03/16/23 32.92 kg/m  05/05/22 32.32 kg/m  04/26/22 31.66 kg/m     Lipids:  No results found for: "CHOL" No results found for: "HDL" No results found for: "LDLCALC" No results found for: "TRIG" No results found for: "CHOLHDL" No results found for: "LDLDIRECT" Glucose:  Glucose, Bld  Date Value Ref Range Status  09/19/2018 82 70 - 99 mg/dL Final  40/98/1191 87 65 - 99 mg/dL Final    Comment:    .            Fasting reference interval .     Flowsheet Row Office Visit from 03/16/2023 in [facility]  AUDIT-C Score 1       Married STD testing and  prevention (HIV/chl/gon/syphilis): ordered Hep C: ordered  Skin cancer: Discussed monitoring for atypical lesions Colorectal cancer: does not qualify Prostate cancer: does not qualify No results found for: "PSA"   Lung cancer:   Low Dose CT Chest recommended if Age 21-80 years, 30 pack-year currently smoking OR have quit w/in 15years. Patient does not qualify.   AAA:  The USPSTF recommends one-time screening with ultrasonography in men ages 67 to 19 years who have ever smoked ECG:  none  Vaccines:  HPV: up to at age 34 , ask insurance if age between 22-45  Shingrix: 74-64 yo and ask insurance if covered when patient above 23 yo Pneumonia:  educated and discussed with patient. Flu:  educated and discussed with patient.  Advanced Care Planning: A voluntary discussion about advance care planning including the explanation and discussion of advance directives.  Discussed health care proxy and Living will, and the patient was able to identify a health care proxy as dad or wife.  Patient does not have a living will at present time. If patient does have living will, I have requested they bring this to the clinic to be scanned in to their chart.  There are no problems  to display for this patient.   Past Surgical History:  Procedure Laterality Date   DENTAL SURGERY     FOOT SURGERY     TONSILLECTOMY N/A 08/05/2021   Procedure: TONSILLECTOMY;  Surgeon: Linus Salmons, MD;  Location: [facility];  Service: ENT;  Laterality: N/A;    Family History  Problem Relation Age of Onset   Lung disease Neg Hx     Social History   Socioeconomic History   Marital status: Married    Spouse name: Not on file   Number of children: Not on file   Years of education: Not on file   Highest education level: Not on file  Occupational History   Not on file  Tobacco Use   Smoking status: Former    Types: Cigarettes, E-cigarettes    Passive exposure: Past   Smokeless tobacco: Never  Vaping  Use   Vaping status: Every Day   Start date: 05/08/2013   Substances: Nicotine, Flavoring  Substance and Sexual Activity   Alcohol use: Yes    Alcohol/week: 1.0 - 2.0 standard drink of alcohol    Types: 1 - 2 Standard drinks or equivalent per week    Comment: once a week   Drug use: No   Sexual activity: Yes  Other Topics Concern   Not on file  Social History Narrative   Not on file   Social Determinants of Health   Financial Resource Strain: Low Risk  (03/16/2023)   Overall Financial Resource Strain (CARDIA)    Difficulty of Paying Living Expenses: Not hard at all  Food Insecurity: No Food Insecurity (03/16/2023)   Hunger Vital Sign    Worried About Running Out of Food in the Last Year: Never true    Ran Out of Food in the Last Year: Never true  Transportation Needs: No Transportation Needs (03/16/2023)   PRAPARE - Administrator, Civil Service (Medical): No    Lack of Transportation (Non-Medical): No  Physical Activity: Not on file  Stress: No Stress Concern Present (03/16/2023)   [employer] of Occupational Health - Occupational Stress Questionnaire    Feeling of Stress : Not at all  Social Connections: Socially Integrated (03/16/2023)   Social Connection and Isolation Panel [NHANES]    Frequency of Communication with Friends and Family: More than three times a week    Frequency of Social Gatherings with Friends and Family: More than three times a week    Attends Religious Services: More than 4 times per year    Active Member of [employer] or Organizations: Yes    Attends Banker Meetings: More than 4 times per year    Marital Status: Married  Catering manager Violence: Not At Risk (03/16/2023)   Humiliation, Afraid, Rape, and Kick questionnaire    Fear of Current or Ex-Partner: No    Emotionally Abused: No    Physically Abused: No    Sexually Abused: No    No current outpatient medications on file.  No Known Allergies   ROS  Constitutional:  Negative for fever or weight change.  Respiratory: Negative for cough and shortness of breath.   Cardiovascular: Negative for chest pain or palpitations.  Gastrointestinal: Negative for abdominal pain, no bowel changes.  Musculoskeletal: Negative for gait problem or joint swelling.  Skin: Negative for rash.  Neurological: Negative for dizziness or headache.  No other specific complaints in a complete review of systems (except as listed in HPI above).    Objective  Vitals:   03/16/23 1015  BP: 120/70  Pulse: 98  Resp: 18  Temp: 98.7 F (37.1 C)  TempSrc: Oral  SpO2: 99%  Weight: 249 lb 8 oz (113.2 kg)  Height: 6\' 1"  (1.854 m)    Body mass index is 32.92 kg/m.  Physical Exam Constitutional: Patient appears well-developed and well-nourished. No distress.  HENT: Head: Normocephalic and atraumatic. Ears: B TMs ok, no erythema or effusion; Nose: Nose normal. Mouth/Throat: Oropharynx is clear and moist. No oropharyngeal exudate.  Eyes: Conjunctivae and EOM are normal. Pupils are equal, round, and reactive to light. No scleral icterus.  Neck: Normal range of motion. Neck supple. No JVD present. No thyromegaly present.  Cardiovascular: Normal rate, regular rhythm and normal heart sounds.  No murmur heard. No BLE edema. Pulmonary/Chest: Effort normal and breath sounds normal. No respiratory distress. Abdominal: Soft. Bowel sounds are normal, no distension. There is no tenderness. no masses Musculoskeletal: Normal range of motion, no joint effusions. No gross deformities Neurological: he is alert and oriented to person, place, and time. No cranial nerve deficit. Coordination, balance, strength, speech and gait are normal.  Skin: Skin is warm and dry. No rash noted. No erythema.  Psychiatric: Patient has a normal mood and affect. behavior is normal. Judgment and thought content normal.   Recent Results (from the past 2160 hour(s))  SARS CORONAVIRUS 2 (TAT 6-24 HRS) Anterior Nasal Swab      Status: None   Collection Time: 02/21/23  6:46 PM   Specimen: Anterior Nasal Swab  Result Value Ref Range   SARS Coronavirus 2 NEGATIVE NEGATIVE    Comment: (NOTE) SARS-CoV-2 target nucleic acids are NOT DETECTED.  The SARS-CoV-2 RNA is generally detectable in upper and lower respiratory specimens during the acute phase of infection. Negative results do not preclude SARS-CoV-2 infection, do not rule out co-infections with other pathogens, and should not be used as the sole basis for treatment or other patient management decisions. Negative results must be combined with clinical observations, patient history, and epidemiological information. The expected result is Negative.  Fact Sheet for Patients: HairSlick.no  Fact Sheet for Healthcare Providers: quierodirigir.com  This test is not yet approved or cleared by the [place] FDA and  has been authorized for detection and/or diagnosis of SARS-CoV-2 by FDA under an Emergency Use Authorization (EUA). This EUA will remain  in effect (meaning this test can be used) for the duration of the COVID-19 declaration under Se ction 564(b)(1) of the Act, 21 U.S.C. section 360bbb-3(b)(1), unless the authorization is terminated or revoked sooner.  Performed at [facility], [address] [place], [place] [address]      Fall Risk:    03/16/2023   10:14 AM 02/06/2017    2:51 PM  Fall Risk   Falls in the past year? 0 No  Number falls in past yr: 0   Injury with Fall? 0   Risk for fall due to : No Fall Risks   Follow up Falls prevention discussed       Functional Status Survey: Is the patient deaf or have difficulty hearing?: No Does the patient have difficulty seeing, even when wearing glasses/contacts?: No Does the patient have difficulty concentrating, remembering, or making decisions?: No Does the patient have difficulty walking or climbing stairs?: No Does the  patient have difficulty dressing or bathing?: No Does the patient have difficulty doing errands alone such as visiting a doctor's office or shopping?: No    Assessment & Plan  1. Annual physical exam  - Tdap vaccine greater than or equal to 7yo IM - CBC with Differential/Platelet - COMPLETE METABOLIC PANEL WITH GFR - Lipid panel - Hemoglobin A1c - HIV Antibody (routine testing w rflx) - Hepatitis C antibody  2. Need for Tdap vaccination  - Tdap vaccine greater than or equal to 7yo IM  3. Screening for diabetes mellitus  - COMPLETE METABOLIC PANEL WITH GFR - Hemoglobin A1c  4. Encounter for hepatitis C screening test for low risk patient  - Hepatitis C antibody  5. Screening for HIV without presence of risk factors  - HIV Antibody (routine testing w rflx)  6. Screening for cholesterol level  - Lipid panel  7. Screening for deficiency anemia  - CBC with Differential/Platelet  8. Encounter to establish care     -Prostate cancer screening and PSA options (with potential risks and benefits of testing vs not testing) were discussed along with recent recs/guidelines. -USPSTF grade A and B recommendations reviewed with patient; age-appropriate recommendations, preventive care, screening tests, etc discussed and encouraged; healthy living encouraged; see AVS for patient education given to patient -Discussed importance of 150 minutes of physical activity weekly, eat two servings of fish weekly, eat one serving of tree nuts ( cashews, pistachios, pecans, almonds.Marland Kitchen) every other day, eat 6 servings of fruit/vegetables daily and drink plenty of water and avoid sweet beverages.  -Reviewed Health Maintenance: yes

## 2023-03-17 LAB — CBC WITH DIFFERENTIAL/PLATELET
Absolute Lymphocytes: 2084 {cells}/uL (ref 850–3900)
Absolute Monocytes: 623 {cells}/uL (ref 200–950)
Basophils Absolute: 74 {cells}/uL (ref 0–200)
Basophils Relative: 1.1 %
Eosinophils Absolute: 221 {cells}/uL (ref 15–500)
Eosinophils Relative: 3.3 %
HCT: 49.2 % (ref 38.5–50.0)
Hemoglobin: 16.6 g/dL (ref 13.2–17.1)
MCH: 31 pg (ref 27.0–33.0)
MCHC: 33.7 g/dL (ref 32.0–36.0)
MCV: 91.8 fL (ref 80.0–100.0)
MPV: 10 fL (ref 7.5–12.5)
Monocytes Relative: 9.3 %
Neutro Abs: 3698 {cells}/uL (ref 1500–7800)
Neutrophils Relative %: 55.2 %
Platelets: 298 10*3/uL (ref 140–400)
RBC: 5.36 10*6/uL (ref 4.20–5.80)
RDW: 12.6 % (ref 11.0–15.0)
Total Lymphocyte: 31.1 %
WBC: 6.7 10*3/uL (ref 3.8–10.8)

## 2023-03-17 LAB — COMPLETE METABOLIC PANEL WITH GFR
AG Ratio: 1.7 (calc) (ref 1.0–2.5)
ALT: 93 U/L — ABNORMAL HIGH (ref 9–46)
AST: 39 U/L (ref 10–40)
Albumin: 4.7 g/dL (ref 3.6–5.1)
Alkaline phosphatase (APISO): 51 U/L (ref 36–130)
BUN: 11 mg/dL (ref 7–25)
CO2: 26 mmol/L (ref 20–32)
Calcium: 9.9 mg/dL (ref 8.6–10.3)
Chloride: 105 mmol/L (ref 98–110)
Creat: 0.91 mg/dL (ref 0.60–1.24)
Globulin: 2.7 g/dL (ref 1.9–3.7)
Glucose, Bld: 98 mg/dL (ref 65–99)
Potassium: 4.5 mmol/L (ref 3.5–5.3)
Sodium: 141 mmol/L (ref 135–146)
Total Bilirubin: 0.5 mg/dL (ref 0.2–1.2)
Total Protein: 7.4 g/dL (ref 6.1–8.1)
eGFR: 117 mL/min/{1.73_m2} (ref >=60)

## 2023-03-17 LAB — LIPID PANEL
Cholesterol: 160 mg/dL (ref <200)
HDL: 44 mg/dL (ref >=40)
LDL Cholesterol (Calc): 96 mg/dL
Non-HDL Cholesterol (Calc): 116 mg/dL (ref <130)
Total CHOL/HDL Ratio: 3.6 (calc) (ref <5.0)
Triglycerides: 107 mg/dL (ref <150)

## 2023-03-17 LAB — HEPATITIS C ANTIBODY: Hepatitis C Ab: NONREACTIVE

## 2023-03-17 LAB — HEMOGLOBIN A1C
Hgb A1c MFr Bld: 5.3 %{Hb} (ref <5.7)
Mean Plasma Glucose: 105 mg/dL
eAG (mmol/L): 5.8 mmol/L

## 2023-03-17 LAB — HIV ANTIBODY (ROUTINE TESTING W REFLEX): HIV 1&2 Ab, 4th Generation: NONREACTIVE

## 2023-03-30 ENCOUNTER — Ambulatory Visit
Admission: EM | Admit: 2023-03-30 | Discharge: 2023-03-30 | Disposition: A | Discharge status: Home / Self Care | Payer: 59 | Attending: Emergency Medicine | Admitting: Emergency Medicine

## 2023-03-30 DIAGNOSIS — H109 Unspecified conjunctivitis: Secondary | ICD-10-CM

## 2023-03-30 DIAGNOSIS — H5789 Other specified disorders of eye and adnexa: Secondary | ICD-10-CM

## 2023-03-30 MED ORDER — PREDNISONE 20 MG PO TABS: 40.0000 mg | ORAL_TABLET | Freq: Every day | ORAL | 0 refills | Status: AC

## 2023-03-30 MED ORDER — ERYTHROMYCIN 5 MG/GM OP OINT: TOPICAL_OINTMENT | OPHTHALMIC | 0 refills | Status: AC

## 2023-03-30 MED ORDER — METHYLPREDNISOLONE ACETATE 80 MG/ML IJ SUSP
80.0000 mg | Freq: Once | INTRAMUSCULAR | Status: AC
  Administered 2023-03-30: 80 mg via INTRAMUSCULAR

## 2023-03-30 NOTE — ED Provider Notes (Signed)
Roy Huffman    CSN: [id number] Arrival date & time: 03/30/23  1547      History   Chief Complaint Chief Complaint  Patient presents with   Eye Pain    HPI Roy Huffman is a 27 y.o. male.   Patient presents for evaluation of left eye pain and swelling beginning today.  Has swollen to the point he is having difficulty opening.  Has not attempted treatment.  Denies drainage, itching, injury or trauma.  Wears glasses but denies use of contacts.  No known sick contacts.  Past Medical History:  Diagnosis Date   Acute medial meniscus tear    ADHD     There are no problems to display for this patient.   Past Surgical History:  Procedure Laterality Date   DENTAL SURGERY     FOOT SURGERY     TONSILLECTOMY N/A 08/05/2021   Procedure: TONSILLECTOMY;  Surgeon: Linus Salmons, MD;  Location: [facility];  Service: ENT;  Laterality: N/A;       Home Medications    Prior to Admission medications   Medication Sig Start Date End Date Taking? Authorizing Provider  erythromycin ophthalmic ointment Place a 1/2 inch ribbon of ointment into the lower eyelid twice a day for 7 days 03/30/23  Yes Shakina Choy R, NP  predniSONE (DELTASONE) 20 MG tablet Take 2 tablets (40 mg total) by mouth daily. 03/30/23  Yes Brazil Voytko, Elita Boone, NP    Family History Family History  Problem Relation Age of Onset   Lung disease Neg Hx     Social History Social History   Tobacco Use   Smoking status: Former    Types: Cigarettes, E-cigarettes    Passive exposure: Past   Smokeless tobacco: Never  Vaping Use   Vaping status: Every Day   Start date: 05/08/2013   Substances: Nicotine, Flavoring  Substance Use Topics   Alcohol use: Yes    Alcohol/week: 1.0 - 2.0 standard drink of alcohol    Types: 1 - 2 Standard drinks or equivalent per week    Comment: once a week   Drug use: No     Allergies   Patient has no known allergies.   Review of Systems Review of Systems   Eyes:  Positive for pain.     Physical Exam Triage Vital Signs ED Triage Vitals  Encounter Vitals Group     BP 03/30/23 1604 119/73     Systolic BP Percentile --      Diastolic BP Percentile --      Pulse Rate 03/30/23 1604 62     Resp 03/30/23 1604 16     Temp 03/30/23 1604 98.6 F (37 C)     Temp Source 03/30/23 1604 Oral     SpO2 03/30/23 1604 99 %     Weight --      Height --      Head Circumference --      Peak Flow --      Pain Score 03/30/23 1605 3     Pain Loc --      Pain Education --      Exclude from Growth Chart --    No data found.  Updated Vital Signs BP 119/73 (BP Location: Left Arm)   Pulse 62   Temp 98.6 F (37 C) (Oral)   Resp 16   SpO2 99%   Visual Acuity Right Eye Distance:   Left Eye Distance:   Bilateral Distance:  Right Eye Near:   Left Eye Near:    Bilateral Near:     Physical Exam Constitutional:      Appearance: Normal appearance.  Eyes:     Comments: Severe left periorbital eye swelling, no drainage noted on exam, scant erythema to the left conjunctiva, vision is grossly intact, extraocular movements are intact  Pulmonary:     Effort: Pulmonary effort is normal.  Neurological:     Mental Status: He is alert and oriented to person, place, and time. Mental status is at baseline.      UC Treatments / Results  Labs (all labs ordered are listed, but only abnormal results are displayed) Labs Reviewed - No data to display  EKG   Radiology No results found.  Procedures Procedures (including critical care time)  Medications Ordered in UC Medications  methylPREDNISolone acetate (DEPO-MEDROL) injection 80 mg (80 mg Intramuscular Given 03/30/23 1621)    Initial Impression / Assessment and Plan / UC Course  I have reviewed the triage vital signs and the nursing notes.  Pertinent labs & imaging results that were available during my care of the patient were reviewed by me and considered in my medical decision making (see  chart for details).  Left eye swelling, bacterial conjunctivitis of left eye  Significant swelling on exam, vision unchanged, as he is experiencing pain will provide coverage for infection as well, prescribed erythromycin ointment and oral prednisone, methylprednisolone IM given prior to discharge, advised against eye rubbing or touching, may use cool to warm compresses, may use oral antihistamines or analgesics as needed for support advised follow-up with urgent care or eye doctor if symptoms continue to persist or worsen Final Clinical Impressions(s) / UC Diagnoses   Final diagnoses:  Bacterial conjunctivitis of left eye  Eye swelling, left     Discharge Instructions      Today you being treated for bacterial conjunctivitis.   Place one drop of polytrim into the effected eye twice daily for 7 days  while awake for 7 days. If the other eye starts to have symptoms you may use medication in it as well. Do not allow tip of dropper to touch eye.  To help reduce swelling you have been given an injection of the steroid today here in the office and on daily will see improvement within the hour  Starting tomorrow take prednisone every morning with food for 5 days to continue reduction of swelling  May use cool compress for comfort and to remove discharge if present. Pat the eye, do not wipe.  Do not rub eyes, this may cause more irritation.  May use Claritin, Zyrtec or benadryl as needed to help if itching present.  If symptoms persist after use of medication, please follow up at Urgent Care or with ophthalmologist (eye doctor)    ED Prescriptions     Medication Sig Dispense Auth. Provider   erythromycin ophthalmic ointment Place a 1/2 inch ribbon of ointment into the lower eyelid twice a day for 7 days 3.5 g Lee-Anne Flicker R, NP   predniSONE (DELTASONE) 20 MG tablet Take 2 tablets (40 mg total) by mouth daily. 10 tablet Valinda Hoar, NP      PDMP not reviewed this  encounter.   Valinda Hoar, NP 03/30/23 1655

## 2023-03-30 NOTE — Discharge Instructions (Signed)
Today you being treated for bacterial conjunctivitis.   Place one drop of polytrim into the effected eye twice daily for 7 days  while awake for 7 days. If the other eye starts to have symptoms you may use medication in it as well. Do not allow tip of dropper to touch eye.  To help reduce swelling you have been given an injection of the steroid today here in the office and on daily will see improvement within the hour  Starting tomorrow take prednisone every morning with food for 5 days to continue reduction of swelling  May use cool compress for comfort and to remove discharge if present. Pat the eye, do not wipe.  Do not rub eyes, this may cause more irritation.  May use Claritin, Zyrtec or benadryl as needed to help if itching present.  If symptoms persist after use of medication, please follow up at Urgent Care or with ophthalmologist (eye doctor)

## 2023-03-30 NOTE — ED Triage Notes (Signed)
Pt states he woke up this morning and his left eye was hurting and is hard to open. Pt states he did not hit his eye or any injury to his eye that he knows of.  Taking benadryl with no relief of symptoms.

## 2023-04-04 ENCOUNTER — Ambulatory Visit: Payer: Self-pay | Admitting: Nurse Practitioner

## 2023-05-04 ENCOUNTER — Encounter: Payer: Self-pay | Admitting: Nurse Practitioner

## 2023-05-04 ENCOUNTER — Ambulatory Visit
Admission: EM | Admit: 2023-05-04 | Discharge: 2023-05-04 | Disposition: A | Discharge status: Home / Self Care | Payer: 59

## 2023-05-04 DIAGNOSIS — J069 Acute upper respiratory infection, unspecified: Secondary | ICD-10-CM | POA: Diagnosis not present

## 2023-05-04 LAB — POC COVID19/FLU A&B COMBO
Covid Antigen, POC: NEGATIVE
Influenza A Antigen, POC: NEGATIVE
Influenza B Antigen, POC: NEGATIVE

## 2023-05-04 LAB — POCT RAPID STREP A (OFFICE): Rapid Strep A Screen: NEGATIVE

## 2023-05-04 NOTE — ED Provider Notes (Signed)
[name]    CSN: [id number] Arrival date & time: 05/04/23  0930      History   Chief Complaint Chief Complaint  Patient presents with   Sore Throat   Nasal Congestion    HPI Roy Huffman is a 27 y.o. male.  Patient presents with fever, congestion, sore throat, cough since yesterday.  He took Tylenol this morning.  No rash, shortness of breath, vomiting, diarrhea.  The history is provided by the patient and medical records.    Past Medical History:  Diagnosis Date   Acute medial meniscus tear    ADHD     There are no active problems to display for this patient.   Past Surgical History:  Procedure Laterality Date   DENTAL SURGERY     FOOT SURGERY     TONSILLECTOMY N/A 08/05/2021   Procedure: TONSILLECTOMY;  Surgeon: Linus Salmons, MD;  Location: [facility];  Service: ENT;  Laterality: N/A;       Home Medications    Prior to Admission medications   Medication Sig Start Date End Date Taking? Authorizing Provider  Multiple Vitamin (MULTIVITAMIN) tablet Take 1 tablet by mouth daily.   Yes [provider]  erythromycin ophthalmic ointment Place a 1/2 inch ribbon of ointment into the lower eyelid twice a day for 7 days 03/30/23   Valinda Hoar, NP  predniSONE (DELTASONE) 20 MG tablet Take 2 tablets (40 mg total) by mouth daily. 03/30/23   Valinda Hoar, NP    Family History Family History  Problem Relation Age of Onset   Lung disease Neg Hx     Social History Social History   Tobacco Use   Smoking status: Former    Types: Cigarettes, E-cigarettes    Passive exposure: Past   Smokeless tobacco: Never  Vaping Use   Vaping status: Every Day   Start date: 05/08/2013   Substances: Nicotine, Flavoring  Substance Use Topics   Alcohol use: Yes    Alcohol/week: 1.0 - 2.0 standard drink of alcohol    Types: 1 - 2 Standard drinks or equivalent per week    Comment: once a week   Drug use: No     Allergies   Patient  has no known allergies.   Review of Systems Review of Systems  Constitutional:  Positive for fever. Negative for chills.  HENT:  Positive for congestion and sore throat. Negative for ear pain.   Respiratory:  Positive for cough. Negative for shortness of breath.      Physical Exam Triage Vital Signs ED Triage Vitals  Encounter Vitals Group     BP 05/04/23 1057 113/78     Systolic BP Percentile --      Diastolic BP Percentile --      Pulse Rate 05/04/23 1046 100     Resp 05/04/23 1046 18     Temp 05/04/23 1046 98.4 F (36.9 C)     Temp src --      SpO2 05/04/23 1046 97 %     Weight --      Height --      Head Circumference --      Peak Flow --      Pain Score --      Pain Loc --      Pain Education --      Exclude from Growth Chart --    No data found.  Updated Vital Signs BP 113/78 (BP Location: Left Arm)  Pulse 100   Temp 98.4 F (36.9 C)   Resp 18   SpO2 97%   Visual Acuity Right Eye Distance:   Left Eye Distance:   Bilateral Distance:    Right Eye Near:   Left Eye Near:    Bilateral Near:     Physical Exam Constitutional:      General: He is not in acute distress. HENT:     Right Ear: Tympanic membrane normal.     Left Ear: Tympanic membrane is erythematous.     Nose: Nose normal.     Mouth/Throat:     Mouth: Mucous membranes are moist.     Pharynx: Posterior oropharyngeal erythema present.  Cardiovascular:     Rate and Rhythm: Normal rate and regular rhythm.     Heart sounds: Normal heart sounds.  Pulmonary:     Effort: Pulmonary effort is normal. No respiratory distress.     Breath sounds: Normal breath sounds.  Skin:    General: Skin is warm and dry.  Neurological:     Mental Status: He is alert.      UC Treatments / Results  Labs (all labs ordered are listed, but only abnormal results are displayed) Labs Reviewed  POC COVID19/FLU A&B COMBO - Normal  POCT RAPID STREP A (OFFICE)    EKG   Radiology No results  found.  Procedures Procedures (including critical care time)  Medications Ordered in UC Medications - No data to display  Initial Impression / Assessment and Plan / UC Course  I have reviewed the triage vital signs and the nursing notes.  Pertinent labs & imaging results that were available during my care of the patient were reviewed by me and considered in my medical decision making (see chart for details).    Viral URI.  Rapid strep negative.  Rapid COVID and flu negative.  Discussed symptomatic treatment including Tylenol or ibuprofen as needed for fever or discomfort, plain Mucinex as needed for congestion, rest, hydration.  Instructed patient to follow-up with PCP if not improving.  ED precautions given.  Patient agrees to plan of care.   Final Clinical Impressions(s) / UC Diagnoses   Final diagnoses:  Viral URI     Discharge Instructions      The strep, COVID and flu tests are negative.   Take Tylenol or ibuprofen as needed for fever or discomfort.  Take plain Mucinex as needed for congestion.  Rest and keep yourself hydrated.    Follow-up with your primary care provider if your symptoms are not improving.         ED Prescriptions   None    PDMP not reviewed this encounter.   Mickie Bail, NP 05/04/23 1141

## 2023-05-04 NOTE — Discharge Instructions (Addendum)
 The strep, COVID and flu tests are negative.   Take Tylenol or ibuprofen as needed for fever or discomfort.  Take plain Mucinex as needed for congestion.  Rest and keep yourself hydrated.    Follow-up with your primary care provider if your symptoms are not improving.

## 2023-05-04 NOTE — ED Triage Notes (Signed)
Fever 100.7, sore throat, cough, congestion x 1 day.  Taking tylenol.   Wife has been in contact with someone positive for covid and is having similar symptoms.

## 2024-03-20 ENCOUNTER — Encounter: Payer: Self-pay | Admitting: Nurse Practitioner

## 2024-05-22 ENCOUNTER — Ambulatory Visit (INDEPENDENT_AMBULATORY_CARE_PROVIDER_SITE_OTHER): Payer: Self-pay | Admitting: Nurse Practitioner

## 2024-05-22 ENCOUNTER — Encounter: Payer: Self-pay | Admitting: Nurse Practitioner

## 2024-05-22 VITALS — BP 138/80 | HR 91 | Temp 98.0°F | Ht 73.0 in | Wt 268.0 lb

## 2024-05-22 DIAGNOSIS — Z130 Encounter for screening for diseases of the blood and blood-forming organs and certain disorders involving the immune mechanism: Secondary | ICD-10-CM

## 2024-05-22 DIAGNOSIS — Z131 Encounter for screening for diabetes mellitus: Secondary | ICD-10-CM | POA: Diagnosis not present

## 2024-05-22 DIAGNOSIS — R7989 Other specified abnormal findings of blood chemistry: Secondary | ICD-10-CM

## 2024-05-22 DIAGNOSIS — Z0001 Encounter for general adult medical examination with abnormal findings: Secondary | ICD-10-CM | POA: Diagnosis not present

## 2024-05-22 DIAGNOSIS — Z13220 Encounter for screening for lipoid disorders: Secondary | ICD-10-CM | POA: Diagnosis not present

## 2024-05-22 DIAGNOSIS — Z0000 Encounter for general adult medical examination without abnormal findings: Secondary | ICD-10-CM

## 2024-05-22 NOTE — Progress Notes (Signed)
 Name: Roy Huffman   MRN: [id number]    DOB: [birth year]   Date:05/22/2024       Progress Note  Subjective  Chief Complaint  Chief Complaint  Patient presents with   Annual Exam    Would like to discuss low testosterone     HPI  Patient presents for annual CPE. Discussed the use of AI scribe software for clinical note transcription with the patient, who gave verbal consent to proceed.  History of Present Illness Roy Huffman is a 29 year old male who presents for an annual physical exam and concerns about low testosterone .  Testosterone  therapy and hypogonadism symptoms - Currently self-administers testosterone  injections every Friday at a dose of 1 mL. - After one month of treatment, experiences improvement in energy, no longer requiring daily naps. - Initial headaches with injections resolved after first two weeks. - Requests testosterone  level check to facilitate insurance coverage; currently pays $220 monthly out of pocket. - Prefers injections over gel to avoid accidental exposure to his son. - Father uses testosterone  gel, which is more expensive out of pocket.  General health maintenance - Did not have insurance for the latter half of the previous year, resulting in delayed routine health maintenance. - Last eye exam was two years ago; plans to schedule an exam soon. - Has not had a recent dental exam and needs to schedule one. - No living will; has designated Elspeth Sprang for medical decision-making if necessary.  Lifestyle and functional status - Weighs 268 pounds with a BMI of 35.36. - Does not engage in regular exercise. - Maintains a well-balanced diet. - Sleeps 6 to 8 hours per night. - Currently stays home with his son.  Genitourinary and gastrointestinal symptoms - No problems with urination. - No problems with bowel movements.      Diet: well balanced diet Exercise: recommend 150 min of physical activity weekly   Sleep: 6-8 hours Last dental exam:needs  to schedule Last eye exam: 2 years ago  Depression: phq 9 is negative    05/22/2024   11:08 AM 03/16/2023   10:15 AM 02/06/2017    2:51 PM  Depression screen PHQ 2/9  Decreased Interest 0 0 1  Down, Depressed, Hopeless 0 0 0  PHQ - 2 Score 0 0 1  Altered sleeping   3  Tired, decreased energy   0  Change in appetite   0  Feeling bad or failure about yourself    0  Trouble concentrating   0  Moving slowly or fidgety/restless   0  Suicidal thoughts   0  PHQ-9 Score   4   Difficult doing work/chores   Not difficult at all     Data saved with a previous flowsheet row definition    Hypertension:  BP Readings from Last 3 Encounters:  05/22/24 138/80  05/04/23 113/78  03/30/23 119/73    Obesity: Wt Readings from Last 3 Encounters:  05/22/24 268 lb (121.6 kg)  03/16/23 249 lb 8 oz (113.2 kg)  05/05/22 245 lb (111.1 kg)   BMI Readings from Last 3 Encounters:  05/22/24 35.36 kg/m  03/16/23 32.92 kg/m  05/05/22 32.32 kg/m    Flowsheet Row Office Visit from 05/22/2024 in [facility]  1 42 inches     Lipids:  Lab Results  Component Value Date   CHOL 160 03/16/2023   Lab Results  Component Value Date   HDL 44 03/16/2023   Lab Results  Component Value  Date   LDLCALC 96 03/16/2023   Lab Results  Component Value Date   TRIG 107 03/16/2023   Lab Results  Component Value Date   CHOLHDL 3.6 03/16/2023   No results found for: LDLDIRECT Glucose:  Glucose, Bld  Date Value Ref Range Status  03/16/2023 98 65 - 99 mg/dL Final    Comment:    .            Fasting reference interval .   09/19/2018 82 70 - 99 mg/dL Final  89/97/7981 87 65 - 99 mg/dL Final    Comment:    .            Fasting reference interval .     Flowsheet Row Office Visit from 05/22/2024 in [facility]  AUDIT-C Score 1     Married STD testing and prevention (HIV/chl/gon/syphilis): completed Hep C: completed  Skin cancer:  Discussed monitoring for atypical lesions Colorectal cancer: does not qualify Prostate cancer: does not qualify No results found for: PSA   Lung cancer:   Low Dose CT Chest recommended if Age 52-80 years, 30 pack-year currently smoking OR have quit w/in 15years. Patient does not qualify.   AAA:  The USPSTF recommends one-time screening with ultrasonography in men ages 59 to 22 years who have ever smoked ECG:  none  Vaccines:  HPV: up to at age 39 , ask insurance if age between 56-45  Shingrix: 25-64 yo and ask insurance if covered when patient above 76 yo Pneumonia:  educated and discussed with patient. Flu:  educated and discussed with patient.  Advanced Care Planning: A voluntary discussion about advance care planning including the explanation and discussion of advance directives.  Discussed health care proxy and Living will, and the patient was able to identify a health care proxy as stephen Blundell.  Patient does not have a living will at present time. If patient does have living will, I have requested they bring this to the clinic to be scanned in to their chart.  There are no active problems to display for this patient.   Past Surgical History:  Procedure Laterality Date   DENTAL SURGERY     FOOT SURGERY     TONSILLECTOMY N/A 08/05/2021   Procedure: TONSILLECTOMY;  Surgeon: Herminio Miu, MD;  Location: [facility];  Service: ENT;  Laterality: N/A;    Family History  Problem Relation Age of Onset   Lung disease Neg Hx     Social History   Socioeconomic History   Marital status: Married    Spouse name: Not on file   Number of children: Not on file   Years of education: Not on file   Highest education level: Not on file  Occupational History   Not on file  Tobacco Use   Smoking status: Former    Types: Cigarettes, E-cigarettes    Passive exposure: Past   Smokeless tobacco: Never  Vaping Use   Vaping status: Every Day   Start date: 05/08/2013    Substances: Nicotine, Flavoring  Substance and Sexual Activity   Alcohol use: Yes    Alcohol/week: 1.0 - 2.0 standard drink of alcohol    Types: 1 - 2 Standard drinks or equivalent per week    Comment: once a week   Drug use: No   Sexual activity: Yes  Other Topics Concern   Not on file  Social History Narrative   Not on file   Social Drivers of Health  Tobacco Use: Medium Risk (05/22/2024)   Patient History    Smoking Tobacco Use: Former    Smokeless Tobacco Use: Never    Passive Exposure: Past  Physicist, Medical Strain: Low Risk (05/22/2024)   Overall Financial Resource Strain (CARDIA)    Difficulty of Paying Living Expenses: Not hard at all  Food Insecurity: No Food Insecurity (05/22/2024)   Epic    Worried About Programme Researcher, Broadcasting/film/video in the Last Year: Never true    Ran Out of Food in the Last Year: Never true  Transportation Needs: No Transportation Needs (05/22/2024)   Epic    Lack of Transportation (Medical): No    Lack of Transportation (Non-Medical): No  Physical Activity: Inactive (05/22/2024)   Exercise Vital Sign    Days of Exercise per Week: 0 days    Minutes of Exercise per Session: 0 min  Stress: No Stress Concern Present (05/22/2024)   [employer] of Occupational Health - Occupational Stress Questionnaire    Feeling of Stress: Not at all  Social Connections: Moderately Integrated (05/22/2024)   Social Connection and Isolation Panel    Frequency of Communication with Friends and Family: Never    Frequency of Social Gatherings with Friends and Family: Never    Attends Religious Services: More than 4 times per year    Active Member of Clubs or Organizations: Yes    Attends Banker Meetings: More than 4 times per year    Marital Status: Married  Catering Manager Violence: Not At Risk (05/22/2024)   Epic    Fear of Current or Ex-Partner: No    Emotionally Abused: No    Physically Abused: No    Sexually Abused: No  Depression (PHQ2-9): Low Risk  (05/22/2024)   Depression (PHQ2-9)    PHQ-2 Score: 0  Alcohol Screen: Low Risk (05/22/2024)   Alcohol Screen    Last Alcohol Screening Score (AUDIT): 1  Housing: Unknown (05/22/2024)   Epic    Unable to Pay for Housing in the Last Year: No    Number of Times Moved in the Last Year: Not on file    Homeless in the Last Year: No  Utilities: Not At Risk (05/22/2024)   Epic    Threatened with loss of utilities: No  Health Literacy: Adequate Health Literacy (05/22/2024)   B1300 Health Literacy    Frequency of need for help with medical instructions: Never    Current Medications[1]  Allergies[2]   ROS  Constitutional: Negative for fever or weight change.  Respiratory: Negative for cough and shortness of breath.   Cardiovascular: Negative for chest pain or palpitations.  Gastrointestinal: Negative for abdominal pain, no bowel changes.  Musculoskeletal: Negative for gait problem or joint swelling.  Skin: Negative for rash.  Neurological: Negative for dizziness or headache.  No other specific complaints in a complete review of systems (except as listed in HPI above).    Objective  Vitals:   05/22/24 1101  BP: 138/80  Pulse: 91  Temp: 98 F (36.7 C)  SpO2: 96%  Weight: 268 lb (121.6 kg)  Height: 6' 1 (1.854 m)    Body mass index is 35.36 kg/m.  Physical Exam Vitals reviewed.  Constitutional:      Appearance: Normal appearance.  HENT:     Head: Normocephalic.     Right Ear: Tympanic membrane normal.     Left Ear: Tympanic membrane normal.     Nose: Nose normal.  Eyes:     Extraocular Movements: Extraocular movements  intact.     Conjunctiva/sclera: Conjunctivae normal.     Pupils: Pupils are equal, round, and reactive to light.  Neck:     Thyroid: No thyroid mass, thyromegaly or thyroid tenderness.  Cardiovascular:     Rate and Rhythm: Normal rate and regular rhythm.     Pulses: Normal pulses.     Heart sounds: Normal heart sounds.  Pulmonary:     Effort:  Pulmonary effort is normal.     Breath sounds: Normal breath sounds.  Abdominal:     General: Bowel sounds are normal.     Palpations: Abdomen is soft.  Musculoskeletal:        General: Normal range of motion.     Cervical back: Normal range of motion and neck supple.     Right lower leg: No edema.     Left lower leg: No edema.  Skin:    General: Skin is warm and dry.     Capillary Refill: Capillary refill takes less than 2 seconds.  Neurological:     General: No focal deficit present.     Mental Status: He is alert and oriented to person, place, and time. Mental status is at baseline.  Psychiatric:        Mood and Affect: Mood normal.        Behavior: Behavior normal.        Thought Content: Thought content normal.        Judgment: Judgment normal.      No results found for this or any previous visit (from the past 2160 hours).   Fall Risk:    03/16/2023   10:14 AM 02/06/2017    2:51 PM  Fall Risk   Falls in the past year? 0 No   Number falls in past yr: 0   Injury with Fall? 0    Risk for fall due to : No Fall Risks   Follow up Falls prevention discussed      Data saved with a previous flowsheet row definition      Functional Status Survey:      Assessment & Plan  Problem List Items Addressed This Visit   None Visit Diagnoses       Annual physical exam    -  Primary   Relevant Orders   CBC with Differential/Platelet   Comprehensive metabolic panel with GFR   Lipid panel   Hemoglobin A1c     Screening for deficiency anemia       Relevant Orders   CBC with Differential/Platelet     Screening for cholesterol level       Relevant Orders   Lipid panel     Screening for diabetes mellitus       Relevant Orders   Comprehensive metabolic panel with GFR   Hemoglobin A1c     Low testosterone  in male       Relevant Orders   Testosterone    Ambulatory referral to Urology      Assessment and Plan Assessment & Plan Hypogonadism, male Currently on  testosterone  injections for one month with improved energy levels. Prefers injections over gel due to concerns about gel exposure to his son. Insurance coverage for testosterone  therapy is a concern, and he is paying out of pocket. - Ordered morning testosterone  level test - Referred to urology for further management and potential insurance coverage - Documented previous testosterone  level of 150 - Will communicate with him regarding urology referral status  Obesity BMI is 35.36, indicating obesity.  Reports a well-balanced diet but lacks regular exercise. - Encouraged 150 minutes of physical activity per week for heart health  General Health Maintenance Due for dental and eye exams. Not yet eligible for colorectal cancer screening. - Schedule dental exam - Schedule eye exam - Discussed colorectal cancer screening guidelines -ordered routine labs      -Prostate cancer screening and PSA options (with potential risks and benefits of testing vs not testing) were discussed along with recent recs/guidelines. -USPSTF grade A and B recommendations reviewed with patient; age-appropriate recommendations, preventive care, screening tests, etc discussed and encouraged; healthy living encouraged; see AVS for patient education given to patient -Discussed importance of 150 minutes of physical activity weekly, eat two servings of fish weekly, eat one serving of tree nuts ( cashews, pistachios, pecans, almonds.SABRA) every other day, eat 6 servings of fruit/vegetables daily and drink plenty of water and avoid sweet beverages.  -Reviewed Health Maintenance: yes     [1]  Current Outpatient Medications:    erythromycin  ophthalmic ointment, Place a 1/2 inch ribbon of ointment into the lower eyelid twice a day for 7 days, Disp: 3.5 g, Rfl: 0   Multiple Vitamin (MULTIVITAMIN) tablet, Take 1 tablet by mouth daily., Disp: , Rfl:    predniSONE  (DELTASONE ) 20 MG tablet, Take 2 tablets (40 mg total) by mouth daily.  (Patient not taking: Reported on 05/22/2024), Disp: 10 tablet, Rfl: 0 [2] No Known Allergies

## 2024-05-23 ENCOUNTER — Ambulatory Visit: Payer: Self-pay | Admitting: Nurse Practitioner

## 2024-05-23 DIAGNOSIS — R748 Abnormal levels of other serum enzymes: Secondary | ICD-10-CM

## 2024-05-23 LAB — COMPREHENSIVE METABOLIC PANEL WITH GFR
AG Ratio: 1.9 (calc) (ref 1.0–2.5)
ALT: 118 U/L — ABNORMAL HIGH (ref 9–46)
AST: 47 U/L — ABNORMAL HIGH (ref 10–40)
Albumin: 4.5 g/dL (ref 3.6–5.1)
Alkaline phosphatase (APISO): 59 U/L (ref 36–130)
BUN: 8 mg/dL (ref 7–25)
CO2: 28 mmol/L (ref 20–32)
Calcium: 9.7 mg/dL (ref 8.6–10.3)
Chloride: 105 mmol/L (ref 98–110)
Creat: 1.02 mg/dL (ref 0.60–1.26)
Globulin: 2.4 g/dL (ref 1.9–3.7)
Glucose, Bld: 87 mg/dL (ref 65–99)
Potassium: 4.3 mmol/L (ref 3.5–5.3)
Sodium: 140 mmol/L (ref 135–146)
Total Bilirubin: 0.6 mg/dL (ref 0.2–1.2)
Total Protein: 6.9 g/dL (ref 6.1–8.1)
eGFR: 101 mL/min/1.73m2

## 2024-05-23 LAB — CBC WITH DIFFERENTIAL/PLATELET
Absolute Lymphocytes: 2257 {cells}/uL (ref 850–3900)
Absolute Monocytes: 784 {cells}/uL (ref 200–950)
Basophils Absolute: 104 {cells}/uL (ref 0–200)
Basophils Relative: 1.4 %
Eosinophils Absolute: 200 {cells}/uL (ref 15–500)
Eosinophils Relative: 2.7 %
HCT: 52 % — ABNORMAL HIGH (ref 39.4–51.1)
Hemoglobin: 17.5 g/dL — ABNORMAL HIGH (ref 13.2–17.1)
MCH: 30.8 pg (ref 27.0–33.0)
MCHC: 33.7 g/dL (ref 31.6–35.4)
MCV: 91.4 fL (ref 81.4–101.7)
MPV: 9.9 fL (ref 7.5–12.5)
Monocytes Relative: 10.6 %
Neutro Abs: 4055 {cells}/uL (ref 1500–7800)
Neutrophils Relative %: 54.8 %
Platelets: 338 Thousand/uL (ref 140–400)
RBC: 5.69 Million/uL (ref 4.20–5.80)
RDW: 12.7 % (ref 11.0–15.0)
Total Lymphocyte: 30.5 %
WBC: 7.4 Thousand/uL (ref 3.8–10.8)

## 2024-05-23 LAB — HEP PANEL, GENERAL
Hep B Core Total Ab: NONREACTIVE
Hep B S Ab: NONREACTIVE
Hepatitis A AB,Total: NONREACTIVE
Hepatitis B Surface Ag: NONREACTIVE
Hepatitis C Ab: NONREACTIVE

## 2024-05-23 LAB — TEST AUTHORIZATION

## 2024-05-23 LAB — ADVANCED WRITTEN NOTIFICATION (AWN) TEST REFUSAL: AWN TEST REFUSED: 7600

## 2024-05-23 LAB — HEMOGLOBIN A1C
Hgb A1c MFr Bld: 5.1 %
Mean Plasma Glucose: 100 mg/dL
eAG (mmol/L): 5.5 mmol/L

## 2024-05-23 LAB — TESTOSTERONE: Testosterone: 518 ng/dL (ref 250–827)

## 2024-05-27 ENCOUNTER — Telehealth: Payer: Self-pay

## 2024-05-27 NOTE — Telephone Encounter (Signed)
 Copied from CRM [id number]. Topic: Clinical - Request for Lab/Test Order >> May 27, 2024  3:26 PM Roy Huffman wrote: Reason for CRM: Patient asking to have lab orders for Liver enzymes   Patient is on MyChart and will get notification

## 2024-05-27 NOTE — Telephone Encounter (Signed)
 I called and notified 4 weeks, is that what you want what's ordered or just repeat cmp?

## 2024-05-28 ENCOUNTER — Other Ambulatory Visit: Payer: Self-pay | Admitting: Nurse Practitioner

## 2024-05-28 DIAGNOSIS — R748 Abnormal levels of other serum enzymes: Secondary | ICD-10-CM
